# Patient Record
Sex: Male | Born: 1995 | Race: White | Hispanic: No | Marital: Single | Smoking: Never smoker
Health system: Southern US, Community
[De-identification: ages and names within clinical notes are randomized; demographics above are authoritative.]

## PROBLEM LIST (undated history)

## (undated) DIAGNOSIS — I514 Myocarditis, unspecified: Secondary | ICD-10-CM

---
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
## 2017-05-24 ENCOUNTER — Observation Stay
Admission: EM | Admit: 2017-05-24 | Discharge: 2017-05-25 | Disposition: A | Discharge status: Home / Self Care | Payer: 59 | Attending: Internal Medicine | Admitting: Internal Medicine

## 2017-05-24 ENCOUNTER — Encounter: Payer: Self-pay | Admitting: Emergency Medicine

## 2017-05-24 ENCOUNTER — Emergency Department: Payer: 59

## 2017-05-24 ENCOUNTER — Other Ambulatory Visit: Payer: Self-pay

## 2017-05-24 ENCOUNTER — Emergency Department
Admit: 2017-05-24 | Discharge: 2017-05-24 | Disposition: A | Discharge status: Home / Self Care | Payer: 59 | Attending: Emergency Medicine | Admitting: Emergency Medicine

## 2017-05-24 DIAGNOSIS — R7989 Other specified abnormal findings of blood chemistry: Secondary | ICD-10-CM

## 2017-05-24 DIAGNOSIS — E876 Hypokalemia: Secondary | ICD-10-CM | POA: Insufficient documentation

## 2017-05-24 DIAGNOSIS — R748 Abnormal levels of other serum enzymes: Secondary | ICD-10-CM | POA: Diagnosis present

## 2017-05-24 DIAGNOSIS — R079 Chest pain, unspecified: Secondary | ICD-10-CM

## 2017-05-24 DIAGNOSIS — R778 Other specified abnormalities of plasma proteins: Secondary | ICD-10-CM

## 2017-05-24 DIAGNOSIS — B349 Viral infection, unspecified: Secondary | ICD-10-CM | POA: Diagnosis not present

## 2017-05-24 DIAGNOSIS — I409 Acute myocarditis, unspecified: Secondary | ICD-10-CM | POA: Diagnosis not present

## 2017-05-24 DIAGNOSIS — I514 Myocarditis, unspecified: Secondary | ICD-10-CM | POA: Diagnosis present

## 2017-05-24 LAB — URINE DRUG SCREEN, QUALITATIVE (ARMC ONLY)
Amphetamines, Ur Screen: NOT DETECTED
BARBITURATES, UR SCREEN: NOT DETECTED
Benzodiazepine, Ur Scrn: NOT DETECTED
Cannabinoid 50 Ng, Ur ~~LOC~~: NOT DETECTED
Cocaine Metabolite,Ur ~~LOC~~: NOT DETECTED
MDMA (Ecstasy)Ur Screen: NOT DETECTED
METHADONE SCREEN, URINE: NOT DETECTED
Opiate, Ur Screen: NOT DETECTED
Phencyclidine (PCP) Ur S: NOT DETECTED
TRICYCLIC, UR SCREEN: NOT DETECTED

## 2017-05-24 LAB — BASIC METABOLIC PANEL
ANION GAP: 9 (ref 5–15)
BUN: 13 mg/dL (ref 6–20)
CO2: 27 mmol/L (ref 22–32)
Calcium: 9.5 mg/dL (ref 8.9–10.3)
Chloride: 103 mmol/L (ref 101–111)
Creatinine, Ser: 0.91 mg/dL (ref 0.61–1.24)
GFR calc Af Amer: >60 mL/min (ref >60)
GLUCOSE: 109 mg/dL — AB (ref 65–99)
POTASSIUM: 3.3 mmol/L — AB (ref 3.5–5.1)
Sodium: 139 mmol/L (ref 135–145)

## 2017-05-24 LAB — TROPONIN I
TROPONIN I: 8.92 ng/mL — AB (ref <0.03)
Troponin I: 4.67 ng/mL (ref <0.03)
Troponin I: 8.37 ng/mL (ref <0.03)

## 2017-05-24 LAB — CBC
HEMATOCRIT: 46.2 % (ref 40.0–52.0)
HEMOGLOBIN: 15.6 g/dL (ref 13.0–18.0)
MCH: 29 pg (ref 26.0–34.0)
MCHC: 33.7 g/dL (ref 32.0–36.0)
MCV: 86.1 fL (ref 80.0–100.0)
Platelets: 247 10*3/uL (ref 150–440)
RBC: 5.36 MIL/uL (ref 4.40–5.90)
RDW: 12.8 % (ref 11.5–14.5)
WBC: 6.3 10*3/uL (ref 3.8–10.6)

## 2017-05-24 LAB — ECHOCARDIOGRAM COMPLETE
Height: 72 in
WEIGHTICAEL: 3200 [oz_av]

## 2017-05-24 LAB — MAGNESIUM: Magnesium: 2 mg/dL (ref 1.7–2.4)

## 2017-05-24 MED ORDER — ONDANSETRON HCL 4 MG/2ML IJ SOLN: 4.0000 mg | Freq: Four times a day (QID) | INTRAMUSCULAR | Status: DC | PRN

## 2017-05-24 MED ORDER — ONDANSETRON HCL 4 MG PO TABS: 4.0000 mg | ORAL_TABLET | Freq: Four times a day (QID) | ORAL | Status: DC | PRN

## 2017-05-24 MED ORDER — POTASSIUM CHLORIDE CRYS ER 20 MEQ PO TBCR
20.0000 meq | EXTENDED_RELEASE_TABLET | Freq: Two times a day (BID) | ORAL | Status: DC
Start: 2017-05-24 — End: 2017-05-25
  Administered 2017-05-24 – 2017-05-25 (×3): 20 meq via ORAL
  Filled 2017-05-24 (×3): qty 1

## 2017-05-24 MED ORDER — ASPIRIN 81 MG PO CHEW
324.0000 mg | CHEWABLE_TABLET | Freq: Once | ORAL | Status: AC
  Administered 2017-05-24: 324 mg via ORAL
  Filled 2017-05-24: qty 4

## 2017-05-24 MED ORDER — ACETAMINOPHEN 650 MG RE SUPP: 650.0000 mg | Freq: Four times a day (QID) | RECTAL | Status: DC | PRN

## 2017-05-24 MED ORDER — ACETAMINOPHEN 325 MG PO TABS: 650.0000 mg | ORAL_TABLET | Freq: Four times a day (QID) | ORAL | Status: DC | PRN

## 2017-05-24 MED ORDER — SODIUM CHLORIDE 0.9% FLUSH
3.0000 mL | Freq: Two times a day (BID) | INTRAVENOUS | Status: DC
  Administered 2017-05-24: 3 mL via INTRAVENOUS

## 2017-05-24 NOTE — Progress Notes (Signed)
*  PRELIMINARY RESULTS* Echocardiogram 2D Echocardiogram has been performed.  Dean Simmons, Dean Simmons 05/24/2017, 2:11 PM

## 2017-05-24 NOTE — ED Provider Notes (Signed)
[facility] Emergency Department Provider Note  ____________________________________________  Time seen: Approximately 12:39 PM  I have reviewed the triage vital signs and the nursing notes.   HISTORY  Chief Complaint Chest Pain   HPI Dean Simmons is a 21 y.o. male no significant past medical history who presents for evaluation of chest pain. Patient reports that the pain started as a mild pressure in the center of his chest 2 days ago. Has been constant and this morning became severe. He endorses mild shortness of breath associated with it. Pain is not pleuritic. The pain is worse when he lays flat. Patient had subjective fever 2 days ago when his symptoms started and mild congestion. According to the mother, the entire family has been exposed to several viruses recently in the household.patient has family history of heart attacks in grandfather at old age. patient denies drugs or smoking. no personal or family history of blood clots, no recent travel immobilization, no leg pain or swelling, no hemoptysis, no exogenous hormones. No cough. History reviewed. No pertinent past medical history.  There are no active problems to display for this patient.   History reviewed. No pertinent surgical history.  Prior to Admission medications   Medication Sig Start Date End Date Taking? Authorizing Provider  cephALEXin (KEFLEX) 500 MG capsule Take 1 capsule (500 mg total) by mouth 2 (two) times daily. Patient not taking: Reported on 05/24/2017 12/27/15   Jene Every, MD    Allergies Patient has no known allergies.  History reviewed. No pertinent family history.  Social History Social History   Tobacco Use  . Smoking status: Never Smoker  . Smokeless tobacco: Never Used  Substance Use Topics  . Alcohol use: No    Frequency: Never  . Drug use: No    Review of Systems  Constitutional: Negative for fever. Eyes: Negative for visual changes. ENT:  Negative for sore throat. Neck: No neck pain  Cardiovascular: + chest pain. Respiratory: + shortness of breath. Gastrointestinal: Negative for abdominal pain, vomiting or diarrhea. Genitourinary: Negative for dysuria. Musculoskeletal: Negative for back pain. Skin: Negative for rash. Neurological: Negative for headaches, weakness or numbness. Psych: No SI or HI  ____________________________________________   PHYSICAL EXAM:  VITAL SIGNS: ED Triage Vitals  Enc Vitals Group     BP 05/24/17 1137 120/64     Pulse Rate 05/24/17 1137 85     Resp 05/24/17 1137 16     Temp 05/24/17 1137 98.6 F (37 C)     Temp Source 05/24/17 1137 Oral     SpO2 05/24/17 1137 97 %     Weight 05/24/17 1138 200 lb (90.7 kg)     Height 05/24/17 1138 6' (1.829 m)     Head Circumference --      Peak Flow --      Pain Score 05/24/17 1137 6     Pain Loc --      Pain Edu? --      Excl. in GC? --     Constitutional: Alert and oriented. Well appearing and in no apparent distress. HEENT:      Head: Normocephalic and atraumatic.         Eyes: Conjunctivae are normal. Sclera is non-icteric.       Mouth/Throat: Mucous membranes are moist.       Neck: Supple with no signs of meningismus. Cardiovascular: Regular rate and rhythm. No murmurs, gallops, or rubs. 2+ symmetrical distal pulses are present in all extremities. No JVD.  Respiratory: Normal respiratory effort. Lungs are clear to auscultation bilaterally. No wheezes, crackles, or rhonchi.  Gastrointestinal: Soft, non tender, and non distended with positive bowel sounds. No rebound or guarding. Musculoskeletal: Nontender with normal range of motion in all extremities. No edema, cyanosis, or erythema of extremities. Neurologic: Normal speech and language. Face is symmetric. Moving all extremities. No gross focal neurologic deficits are appreciated. Skin: Skin is warm, dry and intact. No rash noted. Psychiatric: Mood and affect are normal. Speech and behavior  are normal.  ____________________________________________   LABS (all labs ordered are listed, but only abnormal results are displayed)  Labs Reviewed  BASIC METABOLIC PANEL - Abnormal; Notable for the following components:      Result Value   Potassium 3.3 (*)    Glucose, Bld 109 (*)    All other components within normal limits  TROPONIN I - Abnormal; Notable for the following components:   Troponin I 4.67 (*)    All other components within normal limits  CBC  URINE DRUG SCREEN, QUALITATIVE ([facility] ONLY)   ____________________________________________  EKG  ED ECG REPORT I, Nita Sicklearolina Devell Parkerson, the attending physician, personally viewed and interpreted this ECG.  11:34 - normal sinus rhythm, rate of 77, normal intervals, normal axis, less than 1 mm elevation in aVL with no reciprocal changes.  11:56 - NSR, Rate of 76, normal intervals, normal axis, resolution of elevation in aVL, no ST elevations or depressions.  12:41 - NSR, rate of 72, normal intervals, normal axis, slight PR elevation in aVL with a diffuse mild ST elevation. ____________________________________________  RADIOLOGY  CXR:  No active cardiopulmonary disease. ____________________________________________   PROCEDURES  Procedure(s) performed: None Procedures Critical Care performed: yes  CRITICAL CARE Performed by: Nita Sicklearolina Bradi Arbuthnot  ?  Total critical care time: 40 min  Critical care time was exclusive of separately billable procedures and treating other patients.  Critical care was necessary to treat or prevent imminent or life-threatening deterioration.  Critical care was time spent personally by me on the following activities: development of treatment plan with patient and/or surrogate as well as nursing, discussions with consultants, evaluation of patient's response to treatment, examination of patient, obtaining history from patient or surrogate, ordering and performing treatments and  interventions, ordering and review of laboratory studies, ordering and review of radiographic studies, pulse oximetry and re-evaluation of patient's condition.  ____________________________________________   INITIAL IMPRESSION / ASSESSMENT AND PLAN / ED COURSE  21 y.o. male no significant past medical history who presents for evaluation of chest pain x 2 days worse when lying flat that started when patient had subjective fever and congestion. patient is extremely well appearing, no distress, is normal vital signs, cardiac exam shows no rubs or murmurs, lungs are clear to auscultation, there is no pitting edema. Initial EKG had a less than 1 mm elevation in aVL were no reciprocal changes which resolved on serial EKGs. Patient does seem to have myocarditis based on history, exam. Patient given ASA. Pain is improving and now is 1/10. Discussed with Dr. Okey DupreEnd, on call for cath lab who agrees EKG and story not consistent with STEMI. Consulted Dr. Juliann Paresallwood who recommended ECHO STAT. Spoke with ECHO lab who will come to the ED soon. Patient will continue to be monitored closely on telemetry and reassess frequently. He is going to be admitted to the hospitalist service.      As part of my medical decision making, I reviewed the following data within the electronic MEDICAL RECORD NUMBER History obtained from  family, Nursing notes reviewed and incorporated, Labs reviewed , EKG interpreted , Radiograph reviewed , Discussed with admitting physician , A consult was requested and obtained from this/these consultant(s) Cardiology, Notes from prior ED visits and [place] Controlled Substance Database    Pertinent labs & imaging results that were available during my care of the patient were reviewed by me and considered in my medical decision making (see chart for details).    ____________________________________________   FINAL CLINICAL IMPRESSION(S) / ED DIAGNOSES  Final diagnoses:  Chest pain, unspecified type    Elevated troponin  Acute myocarditis, unspecified myocarditis type      NEW MEDICATIONS STARTED DURING THIS VISIT:  ED Discharge Orders    None       Note:  This document was prepared using Dragon voice recognition software and may include unintentional dictation errors.    Nita SickleVeronese, [place], MD 05/24/17 [phone]

## 2017-05-24 NOTE — ED Notes (Signed)
Patient has urinal at bedside and instructions for use.

## 2017-05-24 NOTE — Significant Event (Signed)
Cardiology STEMI Evaluation Note  Date: 05/24/17 Time: 12:49 PM  I was contacted by Dr. Don PerkingVeronese to evaluate the patient's EKG for possible STEMI.  Patient is a 21 year old with intermittent chest pain for the last 2-3 days and subjective fever.  Pain is reportedly worse with deep inspiration.  Initial troponin is elevated at 4.7.  I have personally reviewed today's EKGs, which demonstrate sinus rhythm and sinus arrhythmia with nonspecific ST changes.  There is subtle ST elevation in multiple leads, though the tracings do not meet STEMI criteria.  Subtle PR depression is also noted.  Given the patient's age, pain, EKG findings, and elevated troponin, I am most suspicious for myopericarditis as opposed to STEMI related to occlusion of an epicardial coronary artery.  I have recommended that Dr. Don PerkingVeronese consult with the on-call general cardiology provider for further assessment.  Yvonne Kendallhristopher Kenidy Crossland, MD [facility] Pager: [phone]

## 2017-05-24 NOTE — ED Triage Notes (Addendum)
Pt c/o central chest tightness that is frequent but intermittent since Sunday. Pain is worse today so came in. Denies SHOB or cough.  Mom thought he felt warm previously but did not check temp. No drug use. Some elevation on EKG

## 2017-05-24 NOTE — H&P (Signed)
[employer] [facility] [place] [facility]   PATIENT NAME: Dean Simmons    MR#:  [id number]  DATE OF BIRTH:  10/09/1995  DATE OF ADMISSION:  05/24/2017  PRIMARY CARE PHYSICIAN: Patient, No Pcp Per   REQUESTING/REFERRING PHYSICIAN: Dr. Nita Sicklearolina Veronese  CHIEF COMPLAINT:   Chief Complaint  Patient presents with  . Chest Pain    HISTORY OF PRESENT ILLNESS:  Dean Simmons  is a 21 y.o. male with no known past medical history who presents to the hospital due to chest pain. Patient says he developed substernal chest pain this morning in the center of his chest nonradiating and not associated with any nausea vomiting diaphoresis. He did have some episodic palpitations and dizziness which have now resolved. Patient presented to the ER as his mother noticed that his heart rate was significantly elevated. In the ER patient underwent routine blood work and was noted to have an elevated troponin of over 4. EKG shows no acute ST or T-wave changes consistent with ischemia. Patient is suspected to have a viral myocarditis and therefore hospitalist services were contacted further treatment and evaluation. As per the mom everyone in the house was sick during the holidays with a viral flu. Patient presently denies any upper respiratory a GI symptoms.  PAST MEDICAL HISTORY:  History reviewed. No pertinent past medical history.  PAST SURGICAL HISTORY:  History reviewed. No pertinent surgical history.  SOCIAL HISTORY:   Social History   Tobacco Use  . Smoking status: Never Smoker  . Smokeless tobacco: Never Used  Substance Use Topics  . Alcohol use: No    Frequency: Never    FAMILY HISTORY:   Family History  Problem Relation Age of Onset  . Heart disease Paternal Grandfather     DRUG ALLERGIES:  No Known Allergies  REVIEW OF SYSTEMS:   Review of Systems  Constitutional: Negative for chills and fever.  HENT: Negative for congestion and tinnitus.   Eyes: Negative for  blurred vision and double vision.  Respiratory: Negative for cough, shortness of breath and wheezing.   Cardiovascular: Positive for chest pain. Negative for orthopnea and PND.  Gastrointestinal: Negative for abdominal pain, diarrhea, nausea and vomiting.  Genitourinary: Negative for dysuria and hematuria.  Neurological: Negative for dizziness, sensory change and focal weakness.  All other systems reviewed and are negative.   MEDICATIONS AT HOME:   Prior to Admission medications   Medication Sig Start Date End Date Taking? Authorizing Provider  cephALEXin (KEFLEX) 500 MG capsule Take 1 capsule (500 mg total) by mouth 2 (two) times daily. Patient not taking: Reported on 05/24/2017 12/27/15   Jene EveryKinner, Robert, MD      VITAL SIGNS:  Blood pressure 117/73, pulse 79, temperature 98.6 F (37 C), temperature source Oral, resp. rate 13, height 6' (1.829 m), weight 90.7 kg (200 lb), SpO2 100 %.  PHYSICAL EXAMINATION:  Physical Exam  GENERAL:  21 y.o.-year-old patient lying in the bed in no acute distress.  EYES: Pupils equal, round, reactive to light and accommodation. No scleral icterus. Extraocular muscles intact.  HEENT: Head atraumatic, normocephalic. Oropharynx and nasopharynx clear. No oropharyngeal erythema, moist oral mucosa  NECK:  Supple, no jugular venous distention. No thyroid enlargement, no tenderness.  LUNGS: Normal breath sounds bilaterally, no wheezing, rales, rhonchi. No use of accessory muscles of respiration.  CARDIOVASCULAR: S1, S2 RRR. No murmurs, rubs, gallops, clicks.  ABDOMEN: Soft, nontender, nondistended. Bowel sounds present. No organomegaly or mass.  EXTREMITIES: No pedal edema, cyanosis, or clubbing. +  2 pedal & radial pulses b/l.   NEUROLOGIC: Cranial nerves II through XII are intact. No focal Motor or sensory deficits appreciated b/l PSYCHIATRIC: The patient is alert and oriented x 3. Good affect.  SKIN: No obvious rash, lesion, or ulcer.   LABORATORY PANEL:    CBC Recent Labs  Lab 05/24/17 1138  WBC 6.3  HGB 15.6  HCT 46.2  PLT 247   ------------------------------------------------------------------------------------------------------------------  Chemistries  Recent Labs  Lab 05/24/17 1138  NA 139  K 3.3*  CL 103  CO2 27  GLUCOSE 109*  BUN 13  CREATININE 0.91  CALCIUM 9.5   ------------------------------------------------------------------------------------------------------------------  Cardiac Enzymes Recent Labs  Lab 05/24/17 1138  TROPONINI 4.67*   ------------------------------------------------------------------------------------------------------------------  RADIOLOGY:  Dg Chest 2 View  Result Date: 05/24/2017 CLINICAL DATA:  Mid chest pain for 2 days. EXAM: CHEST  2 VIEW COMPARISON:  None. FINDINGS: Cardiomediastinal silhouette is normal. Mediastinal contours appear intact. There is no evidence of focal airspace consolidation, pleural effusion or pneumothorax. Osseous structures are without acute abnormality. Soft tissues are grossly normal. IMPRESSION: No active cardiopulmonary disease. Electronically Signed   By: Ted Mcalpineobrinka  Dimitrova M.D.   On: 05/24/2017 12:26     IMPRESSION AND PLAN:   21 year old male with no significant past medical history who presents to the hospital with chest pain and noted to have a elevated Trop.   1. Acute myocarditis-this is the suspected diagnosis for patient's chest pain with elevated troponin. Patient's EKG does not show any evidence of ST or T-wave changes suggestive of ischemia. -We'll observe him on telemetry, cycle his cardiac markers. Continue supportive care. Echocardiogram has been ordered. We'll get a cardiology consult and discussed the case with Dr. Juliann Paresallwood.  2. Hypokalemia - will place on Oral Potassium supplements. Repeat in a.m.  - check Mg. Level.    All the records are reviewed and case discussed with ED provider. Management plans discussed with the  patient, family and they are in agreement.  CODE STATUS: Full code  TOTAL TIME TAKING CARE OF THIS PATIENT: 40 minutes.    Houston SirenSAINANI,Vincy Feliz J M.D on 05/24/2017 at 2:00 PM  Between 7am to 6pm - Pager - [phone]  After 6pm go to [email or web address] - password [email or web address]  Fabio [facility]  Office  [phone]  CC: Primary care physician; Patient, No Pcp Per

## 2017-05-24 NOTE — ED Notes (Signed)
Patient taken to xray.

## 2017-05-24 NOTE — ED Notes (Signed)
Dr. Don PerkingVeronese aware of Troponin of 4.67.

## 2017-05-24 NOTE — ED Notes (Signed)
Patient is unable to void at this time. Family at bedside.

## 2017-05-25 DIAGNOSIS — I409 Acute myocarditis, unspecified: Secondary | ICD-10-CM | POA: Diagnosis not present

## 2017-05-25 LAB — CBC
HCT: 45.7 % (ref 40.0–52.0)
HEMOGLOBIN: 15.3 g/dL (ref 13.0–18.0)
MCH: 28.9 pg (ref 26.0–34.0)
MCHC: 33.5 g/dL (ref 32.0–36.0)
MCV: 86.3 fL (ref 80.0–100.0)
Platelets: 266 10*3/uL (ref 150–440)
RBC: 5.3 MIL/uL (ref 4.40–5.90)
RDW: 12.8 % (ref 11.5–14.5)
WBC: 6.2 10*3/uL (ref 3.8–10.6)

## 2017-05-25 LAB — BASIC METABOLIC PANEL
ANION GAP: 8 (ref 5–15)
BUN: 10 mg/dL (ref 6–20)
CALCIUM: 9.3 mg/dL (ref 8.9–10.3)
CHLORIDE: 106 mmol/L (ref 101–111)
CO2: 25 mmol/L (ref 22–32)
CREATININE: 0.67 mg/dL (ref 0.61–1.24)
GFR calc non Af Amer: >60 mL/min (ref >60)
GLUCOSE: 90 mg/dL (ref 65–99)
Potassium: 3.9 mmol/L (ref 3.5–5.1)
Sodium: 139 mmol/L (ref 135–145)

## 2017-05-25 LAB — INFLUENZA PANEL BY PCR (TYPE A & B)
Influenza A By PCR: NEGATIVE
Influenza B By PCR: NEGATIVE

## 2017-05-25 LAB — HIV ANTIBODY (ROUTINE TESTING W REFLEX): HIV SCREEN 4TH GENERATION: NONREACTIVE

## 2017-05-25 LAB — TROPONIN I: Troponin I: 5.59 ng/mL (ref <0.03)

## 2017-05-25 NOTE — Consult Note (Signed)
Reason for Consult: Elevated troponins abnormal EKG possible myocarditis Referring Physician: Dr. Manuella Ghazi hospitalist  Dean Simmons is an 21 y.o. male.  HPI: Patient is a 21 year old white male who works at [employer] developed a febrile illness over the weekend fever weakness fatigue patient subsequently developed chest discomfort became excruciating midsternal without radiation so he came to the emergency room for evaluation.  Patient states fever eventually resolved he just took over-the-counter medication denies any previous cardiac history no smoking.  Patient states to be doing reasonably well now but was seen in the emergency room was found to have elevated troponin with abnormal EKG. patient had improvement chest pain since his pain anywhere denies any rash no worsening headache.  Chest pain is not related to activity or movement.  History reviewed. No pertinent past medical history.  History reviewed. No pertinent surgical history.  Family History  Problem Relation Age of Onset  . Heart disease Paternal Grandfather     Social History:  reports that  has never smoked. he has never used smokeless tobacco. He reports that he does not drink alcohol or use drugs.  Allergies: No Known Allergies  Medications: I have reviewed the patient's current medications.  Results for orders placed or performed during the hospital encounter of 05/24/17 (from the past 48 hour(s))  Basic metabolic panel     Status: Abnormal   Collection Time: 05/24/17 11:38 AM  Result Value Ref Range   Sodium 139 135 - 145 mmol/L   Potassium 3.3 (L) 3.5 - 5.1 mmol/L   Chloride 103 101 - 111 mmol/L   CO2 27 22 - 32 mmol/L   Glucose, Bld 109 (H) 65 - 99 mg/dL   BUN 13 6 - 20 mg/dL   Creatinine, Ser 0.91 0.61 - 1.24 mg/dL   Calcium 9.5 8.9 - 10.3 mg/dL   GFR calc non Af Amer >60 >60 mL/min   GFR calc Af Amer >60 >60 mL/min    Comment: (NOTE) The eGFR has been calculated using the CKD EPI equation. This calculation  has not been validated in all clinical situations. eGFR's persistently <60 mL/min signify possible Chronic Kidney Disease.    Anion gap 9 5 - 15  CBC     Status: None   Collection Time: 05/24/17 11:38 AM  Result Value Ref Range   WBC 6.3 3.8 - 10.6 K/uL   RBC 5.36 4.40 - 5.90 MIL/uL   Hemoglobin 15.6 13.0 - 18.0 g/dL   HCT 46.2 40.0 - 52.0 %   MCV 86.1 80.0 - 100.0 fL   MCH 29.0 26.0 - 34.0 pg   MCHC 33.7 32.0 - 36.0 g/dL   RDW 12.8 11.5 - 14.5 %   Platelets 247 150 - 440 K/uL  Troponin I     Status: Abnormal   Collection Time: 05/24/17 11:38 AM  Result Value Ref Range   Troponin I 4.67 (HH) <0.03 ng/mL    Comment: CRITICAL RESULT CALLED TO, READ BACK BY AND VERIFIED WITH TONJA WEAVER AT 1234 05/24/17 DAS   Magnesium     Status: None   Collection Time: 05/24/17 11:38 AM  Result Value Ref Range   Magnesium 2.0 1.7 - 2.4 mg/dL  HIV antibody (Routine Testing)     Status: None   Collection Time: 05/24/17  3:42 PM  Result Value Ref Range   HIV Screen 4th Generation wRfx Non Reactive Non Reactive    Comment: (NOTE) Performed At: [facility] [place], [place] [id number] Rush Farmer MD  RS:[id number]   Troponin I     Status: Abnormal   Collection Time: 05/24/17  3:42 PM  Result Value Ref Range   Troponin I 8.92 (HH) <0.03 ng/mL    Comment: CRITICAL VALUE NOTED. VALUE IS CONSISTENT WITH PREVIOUSLY REPORTED/CALLED VALUE.  TFK  Troponin I     Status: Abnormal   Collection Time: 05/24/17  7:25 PM  Result Value Ref Range   Troponin I 8.37 (HH) <0.03 ng/mL    Comment: CRITICAL VALUE NOTED. VALUE IS CONSISTENT WITH PREVIOUSLY REPORTED/CALLED VALUE JJB  Urine Drug Screen, Qualitative ([place] only)     Status: None   Collection Time: 05/24/17 10:42 PM  Result Value Ref Range   Tricyclic, Ur Screen NONE DETECTED NONE DETECTED   Amphetamines, Ur Screen NONE DETECTED NONE DETECTED   MDMA (Ecstasy)Ur Screen NONE DETECTED NONE DETECTED   Cocaine Metabolite,Ur [place]  NONE DETECTED NONE DETECTED   Opiate, Ur Screen NONE DETECTED NONE DETECTED   Phencyclidine (PCP) Ur S NONE DETECTED NONE DETECTED   Cannabinoid 50 Ng, Ur [place] NONE DETECTED NONE DETECTED   Barbiturates, Ur Screen NONE DETECTED NONE DETECTED   Benzodiazepine, Ur Scrn NONE DETECTED NONE DETECTED   Methadone Scn, Ur NONE DETECTED NONE DETECTED    Comment: (NOTE) 093  Tricyclics, urine               Cutoff 1000 ng/mL 200  Amphetamines, urine             Cutoff 1000 ng/mL 300  MDMA (Ecstasy), urine           Cutoff 500 ng/mL 400  Cocaine Metabolite, urine       Cutoff 300 ng/mL 500  Opiate, urine                   Cutoff 300 ng/mL 600  Phencyclidine (PCP), urine      Cutoff 25 ng/mL 700  Cannabinoid, urine              Cutoff 50 ng/mL 800  Barbiturates, urine             Cutoff 200 ng/mL 900  Benzodiazepine, urine           Cutoff 200 ng/mL 1000 Methadone, urine                Cutoff 300 ng/mL 1100 1200 The urine drug screen provides only a preliminary, unconfirmed 1300 analytical test result and should not be used for non-medical 1400 purposes. Clinical consideration and professional judgment should 1500 be applied to any positive drug screen result due to possible 1600 interfering substances. A more specific alternate chemical method 1700 must be used in order to obtain a confirmed analytical result.  1800 Gas chromato graphy / mass spectrometry (GC/MS) is the preferred 1900 confirmatory method.   Troponin I     Status: Abnormal   Collection Time: 05/24/17 11:00 PM  Result Value Ref Range   Troponin I 5.59 (HH) <0.03 ng/mL    Comment: CRITICAL VALUE NOTED. VALUE IS CONSISTENT WITH PREVIOUSLY REPORTED/CALLED VALUE ALV  Basic metabolic panel     Status: None   Collection Time: 05/25/17  5:03 AM  Result Value Ref Range   Sodium 139 135 - 145 mmol/L   Potassium 3.9 3.5 - 5.1 mmol/L   Chloride 106 101 - 111 mmol/L   CO2 25 22 - 32 mmol/L   Glucose, Bld 90 65 - 99 mg/dL   BUN 10 6 -  20 mg/dL  Creatinine, Ser 0.67 0.61 - 1.24 mg/dL   Calcium 9.3 8.9 - 10.3 mg/dL   GFR calc non Af Amer >60 >60 mL/min   GFR calc Af Amer >60 >60 mL/min    Comment: (NOTE) The eGFR has been calculated using the CKD EPI equation. This calculation has not been validated in all clinical situations. eGFR's persistently <60 mL/min signify possible Chronic Kidney Disease.    Anion gap 8 5 - 15  CBC     Status: None   Collection Time: 05/25/17  5:03 AM  Result Value Ref Range   WBC 6.2 3.8 - 10.6 K/uL   RBC 5.30 4.40 - 5.90 MIL/uL   Hemoglobin 15.3 13.0 - 18.0 g/dL   HCT 45.7 40.0 - 52.0 %   MCV 86.3 80.0 - 100.0 fL   MCH 28.9 26.0 - 34.0 pg   MCHC 33.5 32.0 - 36.0 g/dL   RDW 12.8 11.5 - 14.5 %   Platelets 266 150 - 440 K/uL    Dg Chest 2 View  Result Date: 05/24/2017 CLINICAL DATA:  Mid chest pain for 2 days. EXAM: CHEST  2 VIEW COMPARISON:  None. FINDINGS: Cardiomediastinal silhouette is normal. Mediastinal contours appear intact. There is no evidence of focal airspace consolidation, pleural effusion or pneumothorax. Osseous structures are without acute abnormality. Soft tissues are grossly normal. IMPRESSION: No active cardiopulmonary disease. Electronically Signed   By: Fidela Salisbury M.D.   On: 05/24/2017 12:26    Review of Systems  Constitutional: Positive for diaphoresis, fever and malaise/fatigue.  HENT: Positive for congestion.   Eyes: Negative.   Respiratory: Positive for shortness of breath.   Cardiovascular: Positive for chest pain.  Gastrointestinal: Negative.   Genitourinary: Negative.   Musculoskeletal: Positive for myalgias.  Skin: Negative.   Neurological: Positive for weakness.  Endo/Heme/Allergies: Negative.   Psychiatric/Behavioral: Negative.    Blood pressure 109/69, pulse 81, temperature 97.7 F (36.5 C), temperature source Oral, resp. rate 18, height 6' (1.829 m), weight 200 lb (90.7 kg), SpO2 99 %. Physical Exam  Nursing note and vitals  reviewed. Constitutional: He is oriented to person, place, and time. He appears well-developed and well-nourished.  HENT:  Head: Normocephalic and atraumatic.  Eyes: Conjunctivae and EOM are normal. Pupils are equal, round, and reactive to light.  Neck: Normal range of motion. Neck supple.  Cardiovascular: Normal rate, regular rhythm and normal heart sounds.  Respiratory: Effort normal and breath sounds normal.  GI: Soft. Bowel sounds are normal.  Musculoskeletal: Normal range of motion.  Neurological: He is alert and oriented to person, place, and time. He has normal reflexes.  Skin: Skin is warm and dry.  Psychiatric: He has a normal mood and affect.    Assessment/Plan: Probable myocarditis Elevated troponin Viral syndrome Abnormal EKG . Plan Agree with supportive care Hydration Recommend NSAID therapy Continue to follow up troponins With unremarkable echocardiogram would recommend conservative cardiac input Recommend generalized rest and recovery Recommend influenza screening   Hazelle Woollard D Slate Debroux 05/25/2017, 9:18 AM

## 2017-05-25 NOTE — Discharge Instructions (Signed)
Myocarditis, Adult °Myocarditis is a swelling (inflammation) of the heart muscle (myocardium). When the heart becomes inflamed, it cannot pump as well. Severe cases of myocarditis can cause heart failure. °What are the causes? °It is usually caused by a viral infection, although other causes are possible. °What are the signs or symptoms? °Mild cases of myocarditis may not have symptoms. If symptoms do occur, they may include: °· Chest pain. °· Shortness of breath. °· Fast or abnormal heart rhythms. °· Fatigue. °· Fluid retention or swelling in the feet or legs. °· Fever. °· Body aches. °· Fainting. ° °How is this diagnosed? °Myocarditis can be hard to diagnose because it can mimic other diseases. Myocarditis may be suspected if the above symptoms have appeared after a recent infection. A physical exam and other tests may be used to confirm the diagnosis. Some of these tests are: °· Blood tests to check for signs of infection or heart damage. °· Electrocardiography that shows your heart's electrical patterns and rhythms. °· A chest X-ray exam to look at your heart and lungs. °· Echocardiography or other imaging tests to look at how well your heart is working. °· Your health care provider also may recommend a heart (cardiac) catheterization. In this test, a flexible tube (catheter) is inserted into a blood vessel then threaded into the heart. A special instrument can then remove tiny samples of heart muscle tissue (biopsy). The samples are sent to a lab for analysis. ° °How is this treated? °Treatment of myocarditis is aimed at treating the underlying cause and may involve: °· Heart medicine such as beta blockers or angiotensin-converting enzyme (ACE) inhibitors. These help strengthen the heart and help it beat more regularly. °· Diuretic medicine. Extra fluid in the body can make the heart work harder. Diuretic medicine can help get rid of the extra fluid. °· Steroid medicine. In some cases of myocarditis, steroid  medicine is used to reduce swelling. ° °This information is not intended to replace advice given to you by your health care provider. Make sure you discuss any questions you have with your health care provider. °Document Released: 05/06/2005 Document Revised: 11/20/2015 Document Reviewed: 11/06/2012 °Elsevier Interactive Patient Education © 2017 Elsevier Inc. ° °

## 2017-05-25 NOTE — Progress Notes (Signed)
Pt. Discharged home today via wheelchair, with mother. Pt VSS with no complaints.  Pt. And mother educated on medical condition and provided with scheduled follow up appointment w/ Dr. Juliann Paresallwood.

## 2017-05-26 NOTE — Discharge Summary (Signed)
[facility] [place] [facility]   PATIENT NAME: Dean Simmons    MR#:  [id number]  DATE OF BIRTH:  12/03/1995  DATE OF ADMISSION:  05/24/2017   ADMITTING PHYSICIAN: Houston SirenVivek J Sainani, MD  DATE OF DISCHARGE: 05/25/2017  1:12 PM  PRIMARY CARE PHYSICIAN: Patient, No Pcp Per   ADMISSION DIAGNOSIS:  Elevated troponin [R74.8] Chest pain, unspecified type [R07.9] Acute myocarditis, unspecified myocarditis type [I40.9] DISCHARGE DIAGNOSIS:  Active Problems:   Myocarditis (HCC)  SECONDARY DIAGNOSIS:  History reviewed. No pertinent past medical history. HOSPITAL COURSE:  [age] year old male with no significant past medical history admitted with chest pain and noted to have a elevated Trop.   1. Acute myocarditis-this is the likely diagnosis for patient's chest pain with elevated troponin. Patient's EKG does not show any evidence of ST or T-wave changes suggestive of ischemia. - Echocardiogram wnl, seen by Dr. Juliann Paresallwood. - supportive mgmt as this is viral in nature. Patient asymptomatic  2. Hypokalemia - Repleted and Resolved DISCHARGE CONDITIONS:  stable CONSULTS OBTAINED:   DRUG ALLERGIES:  No Known Allergies DISCHARGE MEDICATIONS:   Allergies as of 05/25/2017   No Known Allergies     Medication List    STOP taking these medications   cephALEXin 500 MG capsule Commonly known as:  KEFLEX        DISCHARGE INSTRUCTIONS:   DIET:  Regular diet DISCHARGE CONDITION:  Good ACTIVITY:  Activity as tolerated OXYGEN:  Home Oxygen: No.  Oxygen Delivery: room air DISCHARGE LOCATION:  home   If you experience worsening of your admission symptoms, develop shortness of breath, life threatening emergency, suicidal or homicidal thoughts you must seek medical attention immediately by calling 911 or calling your MD immediately  if symptoms less severe.  You Must read complete instructions/literature along with all the possible adverse reactions/side  effects for all the Medicines you take and that have been prescribed to you. Take any new Medicines after you have completely understood and accpet all the possible adverse reactions/side effects.   Please note  You were cared for by a hospitalist during your hospital stay. If you have any questions about your discharge medications or the care you received while you were in the hospital after you are discharged, you can call the unit and asked to speak with the hospitalist on call if the hospitalist that took care of you is not available. Once you are discharged, your primary care physician will handle any further medical issues. Please note that NO REFILLS for any discharge medications will be authorized once you are discharged, as it is imperative that you return to your primary care physician (or establish a relationship with a primary care physician if you do not have one) for your aftercare needs so that they can reassess your need for medications and monitor your lab values.    On the day of Discharge:  VITAL SIGNS:  Blood pressure 109/69, pulse 81, temperature 97.7 F (36.5 C), temperature source Oral, resp. rate 18, height 6' (1.829 m), weight 90.7 kg (200 lb), SpO2 99 %. PHYSICAL EXAMINATION:  GENERAL:  21 y.o.-year-old patient lying in the bed with no acute distress.  EYES: Pupils equal, round, reactive to light and accommodation. No scleral icterus. Extraocular muscles intact.  HEENT: Head atraumatic, normocephalic. Oropharynx and nasopharynx clear.  NECK:  Supple, no jugular venous distention. No thyroid enlargement, no tenderness.  LUNGS: Normal breath sounds bilaterally, no wheezing, rales,rhonchi or crepitation. No use of accessory muscles of respiration.  CARDIOVASCULAR: S1, S2 normal. No murmurs, rubs, or gallops.  ABDOMEN: Soft, non-tender, non-distended. Bowel sounds present. No organomegaly or mass.  EXTREMITIES: No pedal edema, cyanosis, or clubbing.  NEUROLOGIC: Cranial  nerves II through XII are intact. Muscle strength 5/5 in all extremities. Sensation intact. Gait not checked.  PSYCHIATRIC: The patient is alert and oriented x 3.  SKIN: No obvious rash, lesion, or ulcer.  DATA REVIEW:   CBC Recent Labs  Lab 05/25/17 0503  WBC 6.2  HGB 15.3  HCT 45.7  PLT 266    Chemistries  Recent Labs  Lab 05/24/17 1138 05/25/17 0503  NA 139 139  K 3.3* 3.9  CL 103 106  CO2 27 25  GLUCOSE 109* 90  BUN 13 10  CREATININE 0.91 0.67  CALCIUM 9.5 9.3  MG 2.0  --      Follow-up Information    Alwyn Peaallwood, Dwayne D, MD. Go on 06/01/2017.   Specialties:  Cardiology, Internal Medicine Why:  @11 :30 Contact information: [address] [facility] [place] [id number] [phone]           Management plans discussed with the patient, family and they are in agreement.  CODE STATUS: Prior   TOTAL TIME TAKING CARE OF THIS PATIENT: 45 minutes.    Delfino LovettVipul Asencion Guisinger M.D on 05/26/2017 at 6:56 PM  Between 7am to 6pm - Pager - [phone]  After 6pm go to [email or web address] - Social research officer, [email or web address] [email or web address]  [facility] [facility]  Office  [phone]  CC: Primary care physician; Patient, No Pcp Per   Note: This dictation was prepared with Dragon dictation along with smaller phrase technology. Any transcriptional errors that result from this process are unintentional.

## 2017-08-29 IMAGING — DX DG FOOT 2V*R*
2 series · 2 of 2 positions shown · non-contrast
Comparison: None.

CLINICAL DATA: Snake bite to the medial right heel.

EXAM:
RIGHT FOOT - 2 VIEW

[foot ap]
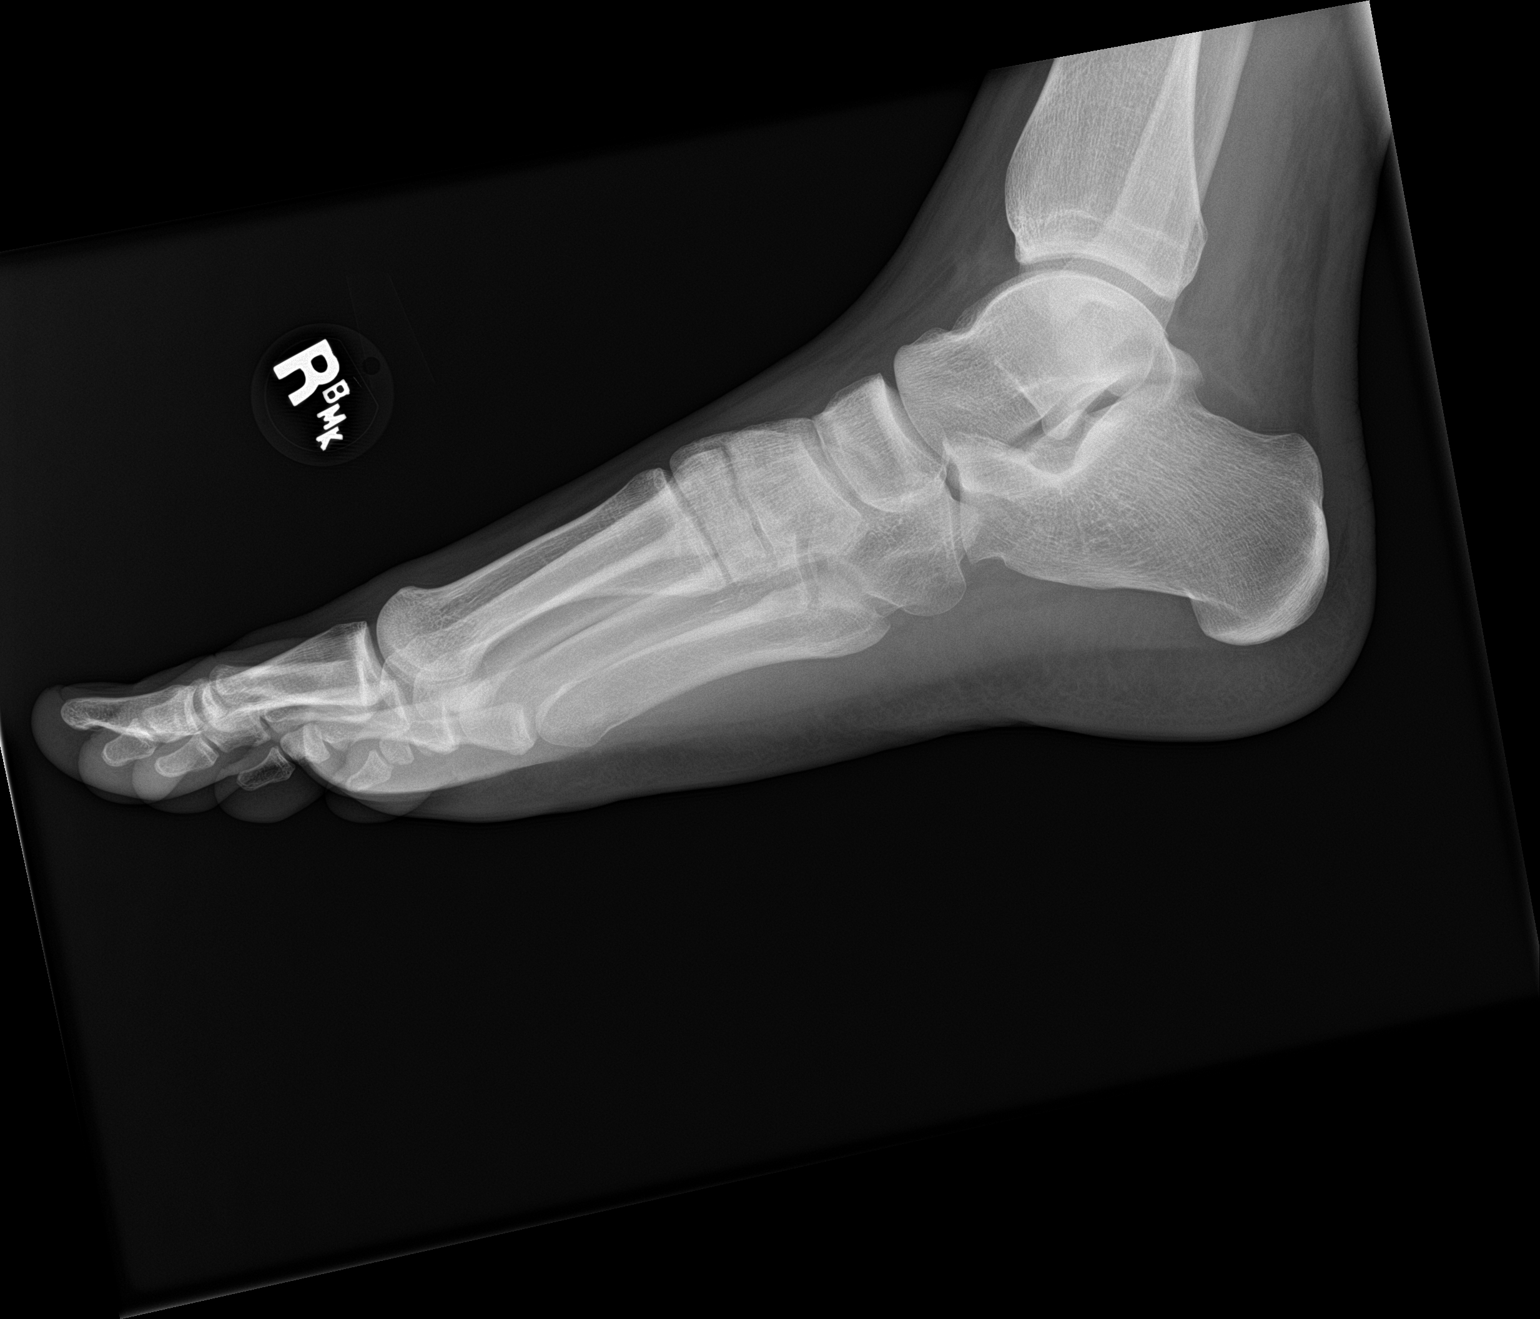

[foot lat]
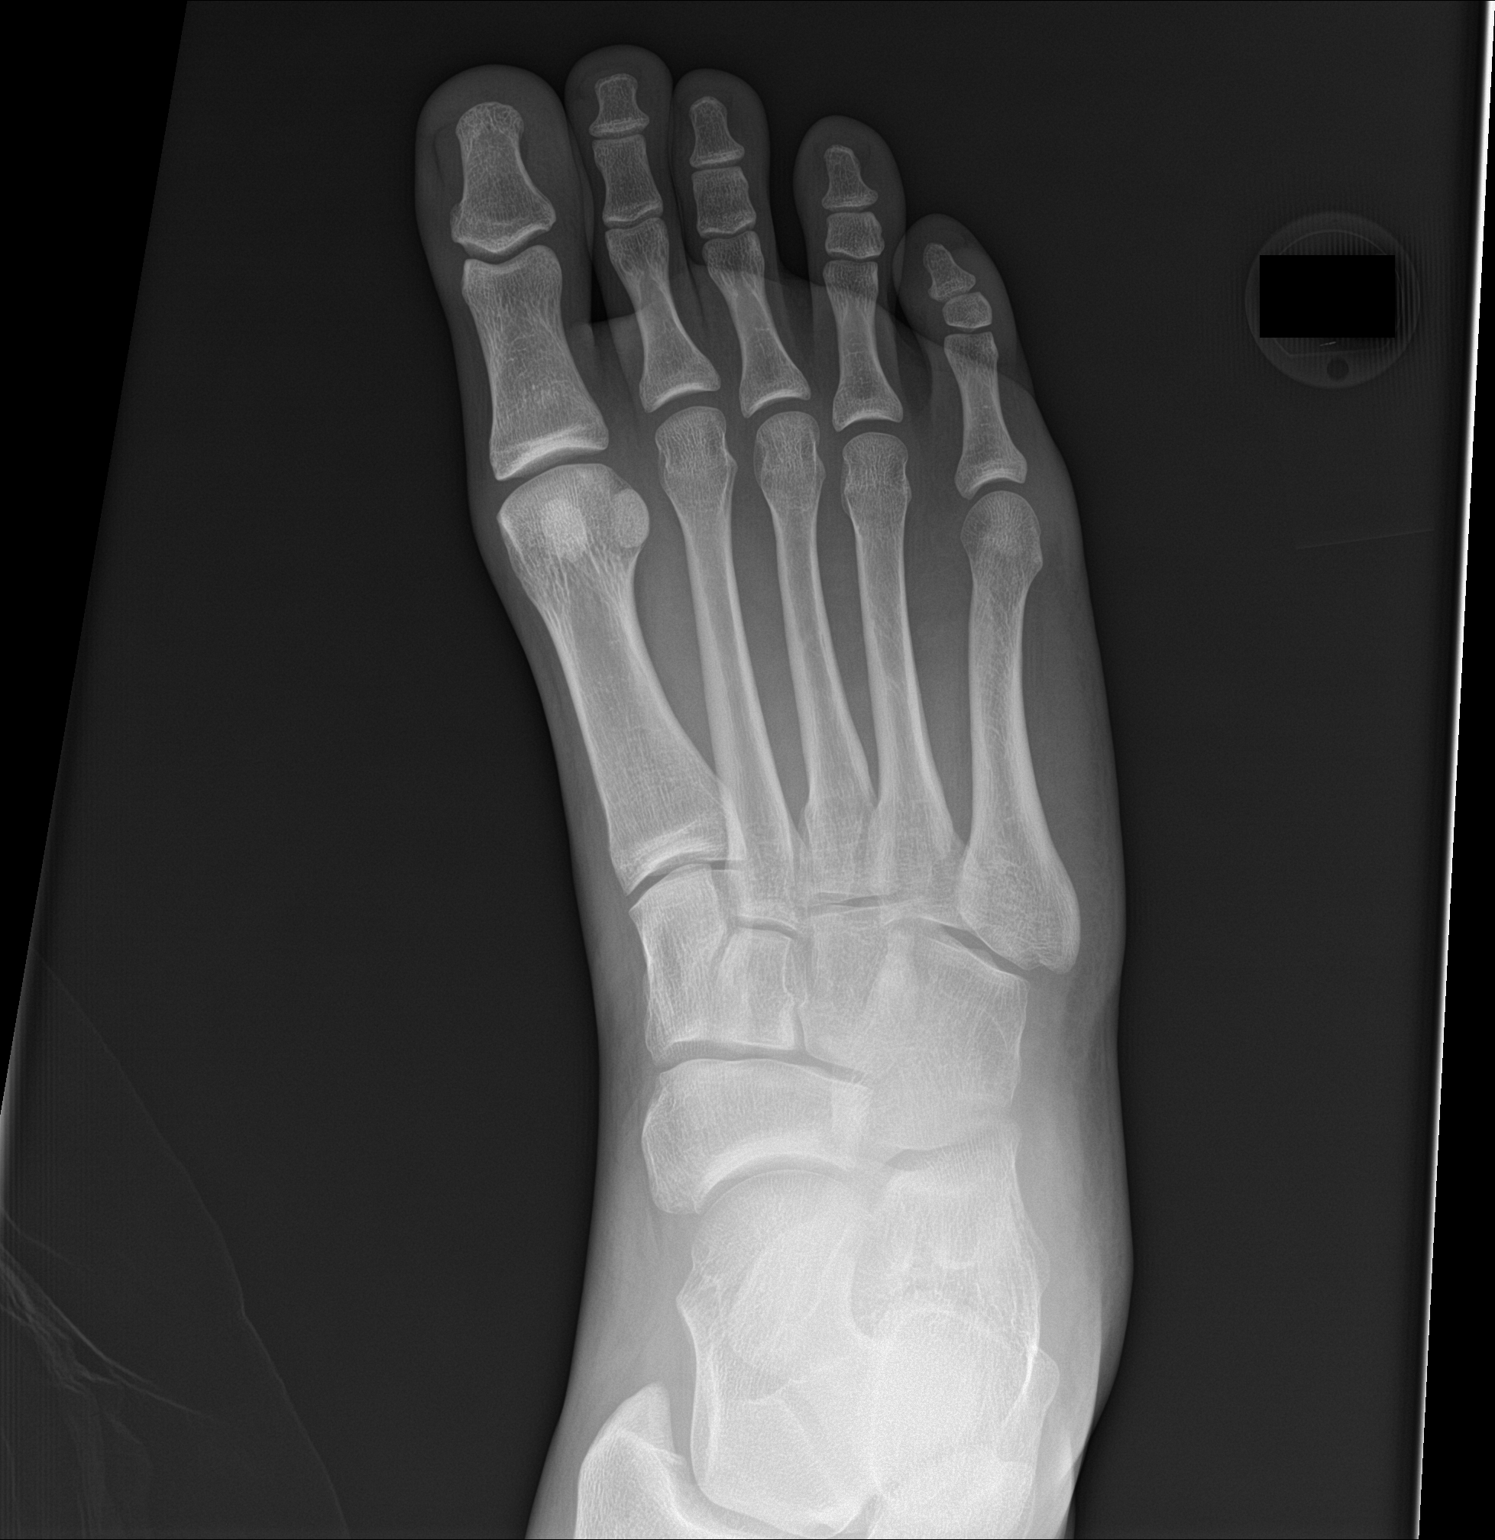

[2 of 2 positions shown; findings below may reference images not displayed]

FINDINGS: No soft tissue gas or soft tissue foreign body. No bony injury.
Normal bones and articulations.
IMPRESSION: Negative.

## 2018-05-27 ENCOUNTER — Other Ambulatory Visit: Payer: Self-pay

## 2018-05-27 ENCOUNTER — Emergency Department: Payer: Commercial Managed Care - PPO

## 2018-05-27 ENCOUNTER — Inpatient Hospital Stay
Admission: EM | Admit: 2018-05-27 | Discharge: 2018-05-28 | DRG: 315 | Disposition: A | Discharge status: Home / Self Care | Payer: Commercial Managed Care - PPO | Attending: Internal Medicine | Admitting: Internal Medicine

## 2018-05-27 DIAGNOSIS — I319 Disease of pericardium, unspecified: Secondary | ICD-10-CM | POA: Diagnosis present

## 2018-05-27 DIAGNOSIS — R079 Chest pain, unspecified: Secondary | ICD-10-CM | POA: Diagnosis present

## 2018-05-27 DIAGNOSIS — Z8249 Family history of ischemic heart disease and other diseases of the circulatory system: Secondary | ICD-10-CM | POA: Diagnosis not present

## 2018-05-27 DIAGNOSIS — I409 Acute myocarditis, unspecified: Secondary | ICD-10-CM | POA: Diagnosis present

## 2018-05-27 HISTORY — DX: Myocarditis, unspecified: I51.4

## 2018-05-27 LAB — TROPONIN I
TROPONIN I: 0.23 ng/mL — AB (ref <0.03)
Troponin I: 0.28 ng/mL (ref <0.03)
Troponin I: 0.38 ng/mL (ref <0.03)

## 2018-05-27 LAB — BASIC METABOLIC PANEL
Anion gap: 9 (ref 5–15)
BUN: 12 mg/dL (ref 6–20)
CHLORIDE: 101 mmol/L (ref 98–111)
CO2: 28 mmol/L (ref 22–32)
CREATININE: 0.8 mg/dL (ref 0.61–1.24)
Calcium: 9.2 mg/dL (ref 8.9–10.3)
GFR calc Af Amer: >60 mL/min (ref >60)
GFR calc non Af Amer: >60 mL/min (ref >60)
Glucose, Bld: 102 mg/dL — ABNORMAL HIGH (ref 70–99)
Potassium: 4 mmol/L (ref 3.5–5.1)
Sodium: 138 mmol/L (ref 135–145)

## 2018-05-27 LAB — CBC
HEMATOCRIT: 48.6 % (ref 39.0–52.0)
Hemoglobin: 16.2 g/dL (ref 13.0–17.0)
MCH: 28.2 pg (ref 26.0–34.0)
MCHC: 33.3 g/dL (ref 30.0–36.0)
MCV: 84.7 fL (ref 80.0–100.0)
NRBC: 0 % (ref 0.0–0.2)
Platelets: 218 10*3/uL (ref 150–400)
RBC: 5.74 MIL/uL (ref 4.22–5.81)
RDW: 12.4 % (ref 11.5–15.5)
WBC: 5.1 10*3/uL (ref 4.0–10.5)

## 2018-05-27 LAB — TSH: TSH: 1.835 u[IU]/mL (ref 0.350–4.500)

## 2018-05-27 MED ORDER — ACETAMINOPHEN 325 MG PO TABS: 650.0000 mg | ORAL_TABLET | Freq: Four times a day (QID) | ORAL | Status: DC | PRN

## 2018-05-27 MED ORDER — ONDANSETRON HCL 4 MG/2ML IJ SOLN: 4.0000 mg | Freq: Four times a day (QID) | INTRAMUSCULAR | Status: DC | PRN

## 2018-05-27 MED ORDER — IBUPROFEN 800 MG PO TABS
800.0000 mg | ORAL_TABLET | Freq: Once | ORAL | Status: AC
  Administered 2018-05-27: 800 mg via ORAL
  Filled 2018-05-27: qty 1

## 2018-05-27 MED ORDER — ACETAMINOPHEN 650 MG RE SUPP: 650.0000 mg | Freq: Four times a day (QID) | RECTAL | Status: DC | PRN

## 2018-05-27 MED ORDER — ENOXAPARIN SODIUM 40 MG/0.4ML ~~LOC~~ SOLN
40.0000 mg | SUBCUTANEOUS | Status: DC
  Administered 2018-05-27: 40 mg via SUBCUTANEOUS
  Filled 2018-05-27: qty 0.4

## 2018-05-27 MED ORDER — ALBUTEROL SULFATE (2.5 MG/3ML) 0.083% IN NEBU: 2.5000 mg | INHALATION_SOLUTION | RESPIRATORY_TRACT | Status: DC | PRN

## 2018-05-27 MED ORDER — ONDANSETRON HCL 4 MG PO TABS: 4.0000 mg | ORAL_TABLET | Freq: Four times a day (QID) | ORAL | Status: DC | PRN

## 2018-05-27 MED ORDER — SODIUM CHLORIDE 0.9 % IV SOLN
Freq: Once | INTRAVENOUS | Status: AC
  Administered 2018-05-27: 17:00:00 via INTRAVENOUS

## 2018-05-27 MED ORDER — IBUPROFEN 600 MG PO TABS
600.0000 mg | ORAL_TABLET | Freq: Four times a day (QID) | ORAL | Status: DC
  Administered 2018-05-27 – 2018-05-28 (×2): 600 mg via ORAL
  Filled 2018-05-27 (×6): qty 1

## 2018-05-27 MED ORDER — SODIUM CHLORIDE 0.9% FLUSH
3.0000 mL | Freq: Two times a day (BID) | INTRAVENOUS | Status: DC
  Administered 2018-05-27 – 2018-05-28 (×2): 3 mL via INTRAVENOUS

## 2018-05-27 NOTE — Progress Notes (Signed)
Pt admitted to unit. Oriented to room. ivf's infusing. Pt is painfree. Tele is nsr. 02 sats 100% on room air. Family at bedside.

## 2018-05-27 NOTE — ED Triage Notes (Addendum)
Pt comes via POV from home with c/o chest pain. Pt states central chest pain. Pt denies any radiation. Pt states chest pain started yesterday.  Pt states he has been sick since Monday with bad headaches and feeling hot.  Mom states hx of myocarditis about a year ago.

## 2018-05-27 NOTE — ED Provider Notes (Signed)
[facility] Emergency Department Provider Note  Time seen: 2:30 PM  I have reviewed the triage vital signs and the nursing notes.   HISTORY  Chief Complaint Chest Pain    HPI Dean Simmons is a 22 y.o. male with a past medical history of myocarditis 1 year ago who presents to the emergency department for chest pain and trouble breathing.  According to the patient for the past 5 days he has been experiencing subjective fever with congestion.  Yesterday developed shortness of breath and mild chest discomfort.  Patient states he had similar symptoms 1 year ago when he was ultimately diagnosed with myocarditis, so the patient came to the emergency department for evaluation.  States minimal chest discomfort currently but she describes more as a tightness/pressure.  Mild shortness of breath much worse with exertion per patient.  Denies any leg pain or swelling.  No abdominal pain.  No nausea vomiting or diarrhea.  Minimal sore throat.   Past Medical History:  Diagnosis Date  . Myocarditis (HCC) 2018    Patient Active Problem List   Diagnosis Date Noted  . Myocarditis (HCC) 05/24/2017    History reviewed. No pertinent surgical history.  Prior to Admission medications   Not on File    No Known Allergies  Family History  Problem Relation Age of Onset  . Heart disease Paternal Grandfather     Social History Social History   Tobacco Use  . Smoking status: Never Smoker  . Smokeless tobacco: Never Used  Substance Use Topics  . Alcohol use: No    Frequency: Never  . Drug use: No    Review of Systems Constitutional: Subjective fever x5 days ENT: Mild sore throat Cardiovascular: Mild chest pressure/tightness since last night Respiratory: Shortness of breath worse with exertion since last night Gastrointestinal: Negative for abdominal pain, vomiting and diarrhea. Genitourinary: Negative for urinary compaints Musculoskeletal: Negative for  musculoskeletal complaints Skin: Negative for skin complaints  Neurological: Negative for headache All other ROS negative  ____________________________________________   PHYSICAL EXAM:  VITAL SIGNS: ED Triage Vitals [05/27/18 1244]  Enc Vitals Group     BP 112/72     Pulse Rate 99     Resp 18     Temp 98.2 F (36.8 C)     Temp Source Oral     SpO2 98 %     Weight 190 lb (86.2 kg)     Height 6' (1.829 m)     Head Circumference      Peak Flow      Pain Score 2     Pain Loc      Pain Edu?      Excl. in GC?    Constitutional: Alert and oriented. Well appearing and in no distress. Eyes: Normal exam ENT   Head: Normocephalic and atraumatic.   Mouth/Throat: Mucous membranes are moist. Cardiovascular: Normal rate, regular rhythm. No murmur Respiratory: Normal respiratory effort without tachypnea nor retractions. Breath sounds are clear  Gastrointestinal: Soft and nontender. No distention.  Musculoskeletal: Nontender with normal range of motion in all extremities. No lower extremity tenderness or edema. Neurologic:  Normal speech and language. No gross focal neurologic deficits Skin:  Skin is warm, dry and intact.  Psychiatric: Mood and affect are normal.   ____________________________________________    EKG  EKG reviewed and interpreted by myself shows a sinus tachycardia 104 bpm with a narrow QRS, normal axis, largely normal intervals.  Patient does have slight ST elevation in inferolateral  leads.  No reciprocal depressions.  Possibly consistent with early repolarization versus pericarditis. ____________________________________________    RADIOLOGY   IMPRESSION: No active cardiopulmonary disease.  ____________________________________________   INITIAL IMPRESSION / ASSESSMENT AND PLAN / ED COURSE  Pertinent labs & imaging results that were available during my care of the patient were reviewed by me and considered in my medical decision making (see chart for  details).  Patient presents to the emergency department for chest discomfort shortness of breath worse with exertion.  Differential would include recurrent myocarditis, ACS, pulmonary edema, pneumonia, pneumothorax, upper respiratory infection, bronchitis.  Patient's labs are resulted showing a elevation of troponin 0 0.28.  EKG consistent with pericarditis versus early repolarization.  I discussed patient with Dr. Juliann Pares of cardiology.  We will send blood cultures, we will treat with NSAIDs and have the patient admitted to the hospitalist service.  ____________________________________________   FINAL CLINICAL IMPRESSION(S) / ED DIAGNOSES  Pericarditis Myocarditis    Minna Antis, MD 05/27/18 1436

## 2018-05-27 NOTE — H&P (Signed)
[facility] [place] at [facility]   PATIENT NAME: Dean Simmons    MR#:  [id number]  DATE OF BIRTH:  11/16/1995  DATE OF ADMISSION:  05/27/2018  PRIMARY CARE PHYSICIAN: Patient, No Pcp Per   REQUESTING/REFERRING PHYSICIAN: dr Thomasene MohairPaduhowski  CHIEF COMPLAINT:   Chest pain shortness of breath since yesterday. Sinus congestion for 2 to 3 days HISTORY OF PRESENT ILLNESS:  Dean Simmons  is a 22 y.o. 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 Patient is similar presentation about a year ago and did fairly well after treated for myocarditis. Troponin was .28. EKG no shows sinus tachycardia with some STT changes. ER physician spoke with Dr. Juliann Paresallwood will admit patient for NSAIDs and cycle troponin's.  No family in the ER.  PAST MEDICAL HISTORY:   Past Medical History:  Diagnosis Date  . Myocarditis (HCC) 2018    PAST SURGICAL HISTOIRY:  History reviewed. No pertinent surgical history.  SOCIAL HISTORY:   Social History   Tobacco Use  . Smoking status: Never Smoker  . Smokeless tobacco: Never Used  Substance Use Topics  . Alcohol use: No    Frequency: Never    FAMILY HISTORY:   Family History  Problem Relation Age of Onset  . Heart disease Paternal Grandfather     DRUG ALLERGIES:  No Known Allergies  REVIEW OF SYSTEMS:  Review of Systems  Constitutional: Negative for chills, fever and weight loss.  HENT: Positive for congestion. Negative for ear discharge, ear pain and nosebleeds.   Eyes: Negative for blurred vision, pain and discharge.  Respiratory: Positive for shortness of breath. Negative for sputum production, wheezing and stridor.   Cardiovascular: Positive for chest pain. Negative for palpitations, orthopnea and PND.  Gastrointestinal: Negative for abdominal pain, diarrhea, nausea and  vomiting.  Genitourinary: Negative for frequency and urgency.  Musculoskeletal: Negative for back pain and joint pain.  Neurological: Negative for sensory change, speech change, focal weakness and weakness.  Psychiatric/Behavioral: Negative for depression and hallucinations. The patient is not nervous/anxious.      MEDICATIONS AT HOME:   Prior to Admission medications   Not on File      VITAL SIGNS:  Blood pressure (!) 113/91, pulse 81, temperature 98.2 F (36.8 C), temperature source Oral, resp. rate 19, height 6' (1.829 m), weight 86.2 kg, SpO2 99 %.  PHYSICAL EXAMINATION:  GENERAL:  22 y.o.-year-old patient lying in the bed with no acute distress.  EYES: Pupils equal, round, reactive to light and accommodation. No scleral icterus. Extraocular muscles intact.  HEENT: Head atraumatic, normocephalic. Oropharynx and nasopharynx clear.  NECK:  Supple, no jugular venous distention. No thyroid enlargement, no tenderness. No rub LUNGS: Normal breath sounds bilaterally, no wheezing, rales,rhonchi or crepitation. No use of accessory muscles of respiration.  CARDIOVASCULAR: S1, S2 normal. No murmurs, rubs, or gallops.  ABDOMEN: Soft, nontender, nondistended. Bowel sounds present. No organomegaly or mass.  EXTREMITIES: No pedal edema, cyanosis, or clubbing.  NEUROLOGIC: Cranial nerves II through XII are intact. Muscle strength 5/5 in all extremities. Sensation intact. Gait not checked.  PSYCHIATRIC: The patient is alert and oriented x 3.  SKIN: No obvious rash, lesion, or ulcer.   LABORATORY PANEL:   CBC Recent Labs  Lab 05/27/18 1246  WBC 5.1  HGB 16.2  HCT 48.6  PLT 218   ------------------------------------------------------------------------------------------------------------------  Chemistries  Recent Labs  Lab 05/27/18 1246  NA 138  K 4.0  CL 101  CO2 28  GLUCOSE 102*  BUN 12  CREATININE 0.80  CALCIUM 9.2    ------------------------------------------------------------------------------------------------------------------  Cardiac Enzymes Recent Labs  Lab 05/27/18 1246  TROPONINI 0.28*   ------------------------------------------------------------------------------------------------------------------  RADIOLOGY:  Dg Chest 2 View  Result Date: 05/27/2018 CLINICAL DATA:  Chest pain EXAM: CHEST - 2 VIEW COMPARISON:  05/24/2017 FINDINGS: The heart size and mediastinal contours are within normal limits. Both lungs are clear. The visualized skeletal structures are unremarkable. IMPRESSION: No active cardiopulmonary disease. Electronically Signed   By: Signa Kell M.D.   On: 05/27/2018 13:32    EKG:   Sinus tachycardia with diffuse STT changes.(self read) IMPRESSION AND PLAN:   Dean Simmons  is a 22 y.o. 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  1. chest pain on exertion with shortness of breath sinus congestion and mild elevated troponin suspected acute myocarditis -patient had similar presentation about a year ago -admit to telemetry floor -cycle cardiac enzymes times three -echo of the heart -Dr. Juliann Pares to see patient--- informed by ER physician  2. DVT prophylaxis subcu Lovenox   No family in the ER All the records are reviewed and case discussed with ED provider. Management plans discussed with the patient, family and they are in agreement.  CODE STATUS: full  TOTAL TIME TAKING CARE OF THIS PATIENT: *40* minutes.    Enedina Finner M.D on 05/27/2018 at 3:42 PM  Between 7am to 6pm - Pager - [phone]  After 6pm go to [email or web address] - password [email or web address] [facility]  [facility]  Office  [phone]  CC: Primary care physician; Patient, No Pcp Per

## 2018-05-27 NOTE — ED Notes (Signed)
Pt flagged has having legal guardian. Mother with patient and asked if she is legal guardian. Mother states that he is 22 years old.

## 2018-05-28 ENCOUNTER — Inpatient Hospital Stay
Admit: 2018-05-28 | Discharge: 2018-05-28 | Disposition: A | Discharge status: Home / Self Care | Payer: Commercial Managed Care - PPO | Attending: Internal Medicine | Admitting: Internal Medicine

## 2018-05-28 LAB — ECHOCARDIOGRAM COMPLETE
Height: 72 in
Weight: 3009.6 oz

## 2018-05-28 LAB — TROPONIN I: Troponin I: 0.13 ng/mL (ref <0.03)

## 2018-05-28 MED ORDER — IBUPROFEN 400 MG PO TABS: 400.0000 mg | ORAL_TABLET | Freq: Three times a day (TID) | ORAL | 0 refills | Status: AC | PRN

## 2018-05-28 NOTE — Discharge Summary (Signed)
[facility] - [place] at [facility]   PATIENT NAME: Dean Simmons    MR#:  [id number]  DATE OF BIRTH:  05-22-1996  DATE OF ADMISSION:  05/27/2018   ADMITTING PHYSICIAN: Enedina Finner, MD  DATE OF DISCHARGE:  05/28/18  PRIMARY CARE PHYSICIAN: Patient, No Pcp Per   ADMISSION DIAGNOSIS:   Chest pain, unspecified type [R07.9] Acute myocarditis, unspecified myocarditis type [I40.9]  DISCHARGE DIAGNOSIS:   Active Problems:   Chest pain   SECONDARY DIAGNOSIS:   Past Medical History:  Diagnosis Date  . Myocarditis (HCC) 2018    HOSPITAL COURSE:   22 year old male with no significant past medical history with upper respiratory infection recently presents to hospital secondary to chest pain  1.  Chest pain with elevated troponin-concern for possible myocarditis/pericarditis -Improved with ibuprofen -Echocardiogram is done and pending -Cardiology consulted.  No further cardiac work-up recommended -Troponins are plateaued. -Patient denies any chest pain at this time.  Up and ambulating without difficulty. -Can be discharged home with ibuprofen as needed.  DISCHARGE CONDITIONS:   Guarded  CONSULTS OBTAINED:   Treatment Team:  Alwyn Pea, MD  DRUG ALLERGIES:   No Known Allergies DISCHARGE MEDICATIONS:   Allergies as of 05/28/2018   No Known Allergies     Medication List    TAKE these medications   ibuprofen 400 MG tablet Commonly known as:  ADVIL,MOTRIN Take 1 tablet (400 mg total) by mouth every 8 (eight) hours as needed.        DISCHARGE INSTRUCTIONS:   1. PCP f/u in 1 week  DIET:   Regular diet  ACTIVITY:   Activity as tolerated  OXYGEN:   Home Oxygen: No.  Oxygen Delivery: room air  DISCHARGE LOCATION:   home   If you experience worsening of your admission symptoms, develop shortness of breath, life threatening emergency, suicidal or homicidal thoughts you must seek medical attention immediately by calling  911 or calling your MD immediately  if symptoms less severe.  You Must read complete instructions/literature along with all the possible adverse reactions/side effects for all the Medicines you take and that have been prescribed to you. Take any new Medicines after you have completely understood and accpet all the possible adverse reactions/side effects.   Please note  You were cared for by a hospitalist during your hospital stay. If you have any questions about your discharge medications or the care you received while you were in the hospital after you are discharged, you can call the unit and asked to speak with the hospitalist on call if the hospitalist that took care of you is not available. Once you are discharged, your primary care physician will handle any further medical issues. Please note that NO REFILLS for any discharge medications will be authorized once you are discharged, as it is imperative that you return to your primary care physician (or establish a relationship with a primary care physician if you do not have one) for your aftercare needs so that they can reassess your need for medications and monitor your lab values.    On the day of Discharge:  VITAL SIGNS:   Blood pressure 116/70, pulse 65, temperature 98 F (36.7 C), temperature source Oral, resp. rate 18, height 6' (1.829 m), weight 85.3 kg, SpO2 100 %.  PHYSICAL EXAMINATION:    GENERAL:  22 y.o.-year-old patient lying in the bed with no acute distress.  EYES: Pupils equal, round, reactive to light and accommodation. No scleral icterus. Extraocular muscles  intact.  HEENT: Head atraumatic, normocephalic. Oropharynx and nasopharynx clear.  NECK:  Supple, no jugular venous distention. No thyroid enlargement, no tenderness.  LUNGS: Normal breath sounds bilaterally, no wheezing, rales,rhonchi or crepitation. No use of accessory muscles of respiration.  CARDIOVASCULAR: S1, S2 normal. No murmurs, rubs, or gallops.  No chest  wall tenderness. -Pain does not worsen with movement, position or deep inspiration ABDOMEN: Soft, non-tender, non-distended. Bowel sounds present. No organomegaly or mass.  EXTREMITIES: No pedal edema, cyanosis, or clubbing.  NEUROLOGIC: Cranial nerves II through XII are intact. Muscle strength 5/5 in all extremities. Sensation intact. Gait not checked.  PSYCHIATRIC: The patient is alert and oriented x 3.  SKIN: No obvious rash, lesion, or ulcer.   DATA REVIEW:   CBC Recent Labs  Lab 05/27/18 1246  WBC 5.1  HGB 16.2  HCT 48.6  PLT 218    Chemistries  Recent Labs  Lab 05/27/18 1246  NA 138  K 4.0  CL 101  CO2 28  GLUCOSE 102*  BUN 12  CREATININE 0.80  CALCIUM 9.2     Microbiology Results  Results for orders placed or performed during the hospital encounter of 05/27/18  Blood culture (routine x 2)     Status: None (Preliminary result)   Collection Time: 05/27/18  3:04 PM  Result Value Ref Range Status   Specimen Description BLOOD RAC  Final   Special Requests   Final    BOTTLES DRAWN AEROBIC AND ANAEROBIC Blood Culture adequate volume   Culture   Final    NO GROWTH < 24 HOURS Performed at [facility], [address], [place] at [place], [place] [address]    Report Status PENDING  Incomplete  Blood culture (routine x 2)     Status: None (Preliminary result)   Collection Time: 05/27/18  3:04 PM  Result Value Ref Range Status   Specimen Description BLOOD LAC  Final   Special Requests   Final    BOTTLES DRAWN AEROBIC AND ANAEROBIC Blood Culture results may not be optimal due to an excessive volume of blood received in culture bottles   Culture   Final    NO GROWTH < 24 HOURS Performed at [facility], [address], [place], [place] [address]    Report Status PENDING  Incomplete    RADIOLOGY:  Dg Chest 2 View  Result Date: 05/27/2018 CLINICAL DATA:  Chest pain EXAM: CHEST - 2 VIEW COMPARISON:  05/24/2017 FINDINGS: The heart size and mediastinal  contours are within normal limits. Both lungs are clear. The visualized skeletal structures are unremarkable. IMPRESSION: No active cardiopulmonary disease. Electronically Signed   By: Signa Kell M.D.   On: 05/27/2018 13:32     Management plans discussed with the patient, family and they are in agreement.  CODE STATUS:     Code Status Orders  (From admission, onward)         Start     Ordered   05/27/18 1637  Full code  Continuous     05/27/18 1636        Code Status History    Date Active Date Inactive Code Status Order ID Comments User Context   05/24/2017 1527 05/25/2017 1612 Full Code [id number]  Houston Siren, MD Inpatient      TOTAL TIME TAKING CARE OF THIS PATIENT: 38 minutes.    Enid Baas M.D on 05/28/2018 at 1:05 PM  Between 7am to 6pm - Pager - [phone]  After 6pm go to [email or web address] -  password [email or web address]  [facility] [place] [facility]  Office  [phone]  CC: Primary care physician; Patient, No Pcp Per   Note: This dictation was prepared with Dragon dictation along with smaller phrase technology. Any transcriptional errors that result from this process are unintentional.

## 2018-05-28 NOTE — Consult Note (Signed)
Reason for Consult: Abnormal EKG possible myocarditis elevated troponin Referring Physician: Dr. Enedina Finner hospitalist  Dean Simmons is an 22 y.o. male.  HPI: 22 year old white male previous history of possible peri-myocarditis about a year ago presented with possible viral syndrome with chest pain fatigue mild shortness of breath came to the emergency room had a baseline abnormal EKG unchanged from previously so had a borderline troponin so was admitted for further assessment evaluation.  Patient feels much better now does not have a regular physician that he follows up with has not seen anyone since his previous admission a year ago  Past Medical History:  Diagnosis Date  . Myocarditis (HCC) 2018    History reviewed. No pertinent surgical history.  Family History  Problem Relation Age of Onset  . Heart disease Paternal Grandfather     Social History:  reports that he has never smoked. He has never used smokeless tobacco. He reports that he does not drink alcohol or use drugs.  Allergies: No Known Allergies  Medications: I have reviewed the patient's current medications.  Results for orders placed or performed during the hospital encounter of 05/27/18 (from the past 48 hour(s))  Basic metabolic panel     Status: Abnormal   Collection Time: 05/27/18 12:46 PM  Result Value Ref Range   Sodium 138 135 - 145 mmol/L   Potassium 4.0 3.5 - 5.1 mmol/L   Chloride 101 98 - 111 mmol/L   CO2 28 22 - 32 mmol/L   Glucose, Bld 102 (H) 70 - 99 mg/dL   BUN 12 6 - 20 mg/dL   Creatinine, Ser 2.95 0.61 - 1.24 mg/dL   Calcium 9.2 8.9 - 62.1 mg/dL   GFR calc non Af Amer >60 >60 mL/min   GFR calc Af Amer >60 >60 mL/min   Anion gap 9 5 - 15    Comment: Performed at [facility], [address] [place], [place] [address]  CBC     Status: None   Collection Time: 05/27/18 12:46 PM  Result Value Ref Range   WBC 5.1 4.0 - 10.5 K/uL   RBC 5.74 4.22 - 5.81 MIL/uL   Hemoglobin 16.2 13.0 -  17.0 g/dL   HCT 78.4 69.6 - 29.5 %   MCV 84.7 80.0 - 100.0 fL   MCH 28.2 26.0 - 34.0 pg   MCHC 33.3 30.0 - 36.0 g/dL   RDW 28.4 13.2 - 44.0 %   Platelets 218 150 - 400 K/uL   nRBC 0.0 0.0 - 0.2 %    Comment: Performed at [facility], [address] [place], [place] [address]  Troponin I - ONCE - STAT     Status: Abnormal   Collection Time: 05/27/18 12:46 PM  Result Value Ref Range   Troponin I 0.28 (HH) <0.03 ng/mL    Comment: CRITICAL RESULT CALLED TO, READ BACK BY AND VERIFIED WITH KIM GAULT AT 1320 ON 05/27/18 BY SNJ Performed at [facility], [address] [place], [place] [address]   Blood culture (routine x 2)     Status: None (Preliminary result)   Collection Time: 05/27/18  3:04 PM  Result Value Ref Range   Specimen Description BLOOD RAC    Special Requests      BOTTLES DRAWN AEROBIC AND ANAEROBIC Blood Culture adequate volume   Culture      NO GROWTH < 24 HOURS Performed at [facility], [address], [place], [place] [address]    Report Status  PENDING   Blood culture (routine x 2)     Status: None (Preliminary result)   Collection Time: 05/27/18  3:04 PM  Result Value Ref Range   Specimen Description BLOOD LAC    Special Requests      BOTTLES DRAWN AEROBIC AND ANAEROBIC Blood Culture results may not be optimal due to an excessive volume of blood received in culture bottles   Culture      NO GROWTH < 24 HOURS Performed at [facility], [address] [place], [place] [address]    Report Status PENDING   Troponin I - Now Then Q6H     Status: Abnormal   Collection Time: 05/27/18  4:51 PM  Result Value Ref Range   Troponin I 0.38 (HH) <0.03 ng/mL    Comment: CRITICAL VALUE NOTED. VALUE IS CONSISTENT WITH PREVIOUSLY REPORTED/CALLED VALUE.PMF Performed at [facility], [address] [place], [place] [address]   TSH     Status: None   Collection Time: 05/27/18  4:51 PM  Result Value Ref Range   TSH 1.835  0.350 - 4.500 uIU/mL    Comment: Performed by a 3rd Generation assay with a functional sensitivity of <=0.01 uIU/mL. Performed at [facility], [address] [place], [place] [address]   Troponin I - Now Then Q6H     Status: Abnormal   Collection Time: 05/27/18 10:14 PM  Result Value Ref Range   Troponin I 0.23 (HH) <0.03 ng/mL    Comment: CRITICAL VALUE NOTED. VALUE IS CONSISTENT WITH PREVIOUSLY REPORTED/CALLED VALUE / JAG Performed at [facility], [address] [place], [place] [address]   Troponin I - Now Then Q6H     Status: Abnormal   Collection Time: 05/28/18  5:50 AM  Result Value Ref Range   Troponin I 0.13 (HH) <0.03 ng/mL    Comment: Performed at [facility], [address] [place], [place] [address]    Dg Chest 2 View  Result Date: 05/27/2018 CLINICAL DATA:  Chest pain EXAM: CHEST - 2 VIEW COMPARISON:  05/24/2017 FINDINGS: The heart size and mediastinal contours are within normal limits. Both lungs are clear. The visualized skeletal structures are unremarkable. IMPRESSION: No active cardiopulmonary disease. Electronically Signed   By: Signa Kell M.D.   On: 05/27/2018 13:32    Review of Systems  Constitutional: Positive for diaphoresis and malaise/fatigue.  HENT: Positive for congestion.   Eyes: Negative.   Respiratory: Positive for shortness of breath.   Cardiovascular: Positive for chest pain.  Gastrointestinal: Negative.   Genitourinary: Negative.   Musculoskeletal: Positive for myalgias.  Skin: Negative.   Neurological: Positive for weakness.  Endo/Heme/Allergies: Negative.   Psychiatric/Behavioral: Negative.    Blood pressure 116/70, pulse 65, temperature 98 F (36.7 C), temperature source Oral, resp. rate 18, height 6' (1.829 m), weight 85.3 kg, SpO2 100 %. Physical Exam  Nursing note and vitals reviewed. Constitutional: He is oriented to person, place, and time. He appears well-developed and well-nourished.  HENT:   Head: Normocephalic and atraumatic.  Eyes: Pupils are equal, round, and reactive to light. Conjunctivae and EOM are normal.  Neck: Normal range of motion. Neck supple.  Cardiovascular: Normal rate and regular rhythm.  Respiratory: Effort normal and breath sounds normal.  GI: Soft. Bowel sounds are normal.  Musculoskeletal: Normal range of motion.  Neurological: He is alert and oriented to person, place, and time. He has normal reflexes.  Skin: Skin is warm and dry.  Psychiatric: He has  a normal mood and affect.    Assessment/Plan: Atypical chest pain Borderline troponins Abnormal EKG Possible mild pericarditis Possible myocarditis Possible viral syndrome . Plan Agree with observation on telemetry Rule up troponins EKG Continue conservative therapy Ibuprofen as necessary for pain and temperature Follow-up with primary physician as an outpatient   Dwayne D Callwood 05/28/2018, 2:47 PM

## 2018-05-28 NOTE — Plan of Care (Signed)
Patient denies any chest pain

## 2018-05-28 NOTE — Progress Notes (Signed)
*  PRELIMINARY RESULTS* Echocardiogram 2D Echocardiogram has been performed.  Dean GulaJoan M [name] 05/28/2018, 10:39 AM

## 2018-06-01 LAB — CULTURE, BLOOD (ROUTINE X 2)
CULTURE: NO GROWTH
Culture: NO GROWTH
Special Requests: ADEQUATE

## 2018-06-06 ENCOUNTER — Telehealth: Payer: Self-pay

## 2018-06-06 NOTE — Telephone Encounter (Signed)
Flagged on EMMI report for not having a follow up appointment scheduled.  AVS does not mention a follow up needed, however patient's discharge summary includes instruction to follow up with PCP in one week.  Called and spoke with patient.  Made him aware that the hospitalist wanted him to follow up with his PCP in one week.  Apologized this was not included on his discharge papers.  Patient acknowledged this. No questions or concerns at this time.

## 2021-02-23 ENCOUNTER — Other Ambulatory Visit: Payer: Self-pay

## 2021-02-23 ENCOUNTER — Emergency Department: Payer: Commercial Managed Care - PPO

## 2021-02-23 ENCOUNTER — Emergency Department
Admission: EM | Admit: 2021-02-23 | Discharge: 2021-02-23 | Disposition: A | Discharge status: Home / Self Care | Payer: Commercial Managed Care - PPO | Attending: Emergency Medicine | Admitting: Emergency Medicine

## 2021-02-23 DIAGNOSIS — R079 Chest pain, unspecified: Secondary | ICD-10-CM

## 2021-02-23 LAB — CBC
HCT: 46.9 % (ref 39.0–52.0)
Hemoglobin: 16 g/dL (ref 13.0–17.0)
MCH: 29.3 pg (ref 26.0–34.0)
MCHC: 34.1 g/dL (ref 30.0–36.0)
MCV: 85.7 fL (ref 80.0–100.0)
Platelets: 152 10*3/uL (ref 150–400)
RBC: 5.47 MIL/uL (ref 4.22–5.81)
RDW: 12.6 % (ref 11.5–15.5)
WBC: 6.3 10*3/uL (ref 4.0–10.5)
nRBC: 0 % (ref 0.0–0.2)

## 2021-02-23 LAB — LIPASE, BLOOD: Lipase: 24 U/L (ref 11–51)

## 2021-02-23 LAB — BASIC METABOLIC PANEL
Anion gap: 6 (ref 5–15)
BUN: 7 mg/dL (ref 6–20)
CO2: 23 mmol/L (ref 22–32)
Calcium: 9.2 mg/dL (ref 8.9–10.3)
Chloride: 110 mmol/L (ref 98–111)
Creatinine, Ser: 0.85 mg/dL (ref 0.61–1.24)
GFR, Estimated: >60 mL/min (ref >60)
Glucose, Bld: 95 mg/dL (ref 70–99)
Potassium: 4.4 mmol/L (ref 3.5–5.1)
Sodium: 139 mmol/L (ref 135–145)

## 2021-02-23 LAB — HEPATIC FUNCTION PANEL
ALT: 47 U/L — ABNORMAL HIGH (ref 0–44)
AST: 32 U/L (ref 15–41)
Albumin: 4.6 g/dL (ref 3.5–5.0)
Alkaline Phosphatase: 59 U/L (ref 38–126)
Bilirubin, Direct: 0.1 mg/dL (ref 0.0–0.2)
Indirect Bilirubin: 0.9 mg/dL (ref 0.3–0.9)
Total Bilirubin: 1 mg/dL (ref 0.3–1.2)
Total Protein: 7.1 g/dL (ref 6.5–8.1)

## 2021-02-23 LAB — TROPONIN I (HIGH SENSITIVITY)
Troponin I (High Sensitivity): 3 ng/L (ref <18)
Troponin I (High Sensitivity): <2 ng/L (ref <18)

## 2021-02-23 MED ORDER — FAMOTIDINE 20 MG PO TABS: 20.0000 mg | ORAL_TABLET | Freq: Every day | ORAL | 1 refills | Status: AC

## 2021-02-23 MED ORDER — LIDOCAINE VISCOUS HCL 2 % MT SOLN
15.0000 mL | Freq: Once | OROMUCOSAL | Status: AC
  Administered 2021-02-23: 15 mL via ORAL
  Filled 2021-02-23: qty 15

## 2021-02-23 MED ORDER — ALUM & MAG HYDROXIDE-SIMETH 200-200-20 MG/5ML PO SUSP
30.0000 mL | Freq: Once | ORAL | Status: AC
  Administered 2021-02-23: 30 mL via ORAL
  Filled 2021-02-23: qty 30

## 2021-02-23 NOTE — Discharge Instructions (Addendum)
Please seek medical attention for any high fevers, chest pain, shortness of breath, change in behavior, persistent vomiting, bloody stool or any other new or concerning symptoms.  

## 2021-02-23 NOTE — ED Notes (Signed)
MD at the bedside  

## 2021-02-23 NOTE — ED Notes (Signed)
Patient reports he is feeling better after the GI cocktail

## 2021-02-23 NOTE — ED Triage Notes (Signed)
Pt to ED for right sided cp that started at work today, pain intermittent.  Denies n/v/shob Hx of myocarditis    Pt states does not have legal guardian

## 2021-02-23 NOTE — ED Provider Notes (Signed)
[facility] Emergency Department Provider Note   ____________________________________________   I have reviewed the triage vital signs and the nursing notes.   HISTORY  Chief Complaint Chest Pain   History limited by: Not Limited   HPI Dean Simmons is a 25 y.o. male who presents to the emergency department today because of concern for chest pain. The patient states that it started today while he was at work. The pain initially was in the left side of his chest but has then moved to the right side. He describes the pain as being both sharp and dull like. The patient denies any associated shortness of breath. Has not had any nausea or vomiting with the pain. 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 The patient states that over the past couple of days he has felt at his baseline health.    Records reviewed. Per medical record review patient has a history of myocarditis.   Past Medical History:  Diagnosis Date   Myocarditis (HCC) 2018    Patient Active Problem List   Diagnosis Date Noted   Chest pain 05/27/2018   Myocarditis (HCC) 05/24/2017    No past surgical history on file.  Prior to Admission medications   Medication Sig Start Date End Date Taking? Authorizing Provider  ibuprofen (ADVIL,MOTRIN) 400 MG tablet Take 1 tablet (400 mg total) by mouth every 8 (eight) hours as needed. 05/28/18   Enid Baas, MD    Allergies Patient has no known allergies.  Family History  Problem Relation Age of Onset   Heart disease Paternal Grandfather     Social History Social History   Tobacco Use   Smoking status: Never   Smokeless tobacco: Never  Substance Use Topics   Alcohol use: No   Drug use: No    Review of Systems Constitutional: No fever/chills Eyes: No visual changes. ENT: No sore throat. Cardiovascular: Positive for chest pain. Respiratory: Denies shortness of breath. Gastrointestinal: No abdominal pain.  No nausea, no  vomiting.  No diarrhea.   Genitourinary: Negative for dysuria. Musculoskeletal: Negative for back pain. Skin: Negative for rash. Neurological: Negative for headaches, focal weakness or numbness.  ____________________________________________   PHYSICAL EXAM:  VITAL SIGNS: ED Triage Vitals [02/23/21 1825]  Enc Vitals Group     BP 130/86     Pulse Rate 90     Resp 18     Temp 98.4 F (36.9 C)     Temp Source Oral     SpO2 98 %     Weight 180 lb (81.6 kg)     Height 6' (1.829 m)     Head Circumference      Peak Flow      Pain Score 2   Constitutional: Alert and oriented.  Eyes: Conjunctivae are normal.  ENT      Head: Normocephalic and atraumatic.      Nose: No congestion/rhinnorhea.      Mouth/Throat: Mucous membranes are moist.      Neck: No stridor. Hematological/Lymphatic/Immunilogical: No cervical lymphadenopathy. Cardiovascular: Normal rate, regular rhythm.  No murmurs, rubs, or gallops.  Respiratory: Normal respiratory effort without tachypnea nor retractions. Breath sounds are clear and equal bilaterally. No wheezes/rales/rhonchi. Gastrointestinal: Soft and tender to palpation in the epigastric region. Genitourinary: Deferred Musculoskeletal: Normal range of motion in all extremities. No lower extremity edema. Neurologic:  Normal speech and language. No gross focal neurologic deficits are appreciated.  Skin:  Skin is warm, dry and intact. No rash noted. Psychiatric:  Mood and affect are normal. Speech and behavior are normal. Patient exhibits appropriate insight and judgment.  ____________________________________________    LABS (pertinent positives/negatives)  Trop hs 3 CBC wbc 6.3, hgb 16.0, plt 152 BMP wnl  ____________________________________________   EKG  I, Phineas Semen, attending physician, personally viewed and interpreted this EKG  EKG Time: 1830 Rate: 85 Rhythm: normal sinus rhythm Axis: normal Intervals: qtc 402 QRS: narrow ST  changes: no st elevation Impression: normal ekg ____________________________________________    RADIOLOGY  CXR No acute abnormality  ____________________________________________   PROCEDURES  Procedures  ____________________________________________   INITIAL IMPRESSION / ASSESSMENT AND PLAN / ED COURSE  Pertinent labs & imaging results that were available during my care of the patient were reviewed by me and considered in my medical decision making (see chart for details).   Patient presented to the emergency department today because of concerns for chest pain.  On exam he did have some slight epigastric pain.  No respiratory distress.  Lungs clear.  Patient has a history of myocarditis however serial tropes negative here.  Chest x-ray without concerning pneumonia or pneumothorax.  I doubt PE or dissection.  He did feel somewhat better after GI cocktail.  This point I do wonder if heartburn discussed the patient's symptoms.  Discussed this with the patient.  Will start on antiacid and give dietary guidelines.  ____________________________________________   FINAL CLINICAL IMPRESSION(S) / ED DIAGNOSES  Final diagnoses:  Nonspecific chest pain     Note: This dictation was prepared with Dragon dictation. Any transcriptional errors that result from this process are unintentional     Phineas Semen, MD 02/23/21 2259

## 2021-02-23 NOTE — ED Notes (Signed)
Per note in pts chart pt no longer has legal guardian due to age and mother confirmed on last admission per note in chart.

## 2021-11-02 ENCOUNTER — Emergency Department: Payer: Commercial Managed Care - PPO

## 2021-11-02 ENCOUNTER — Encounter: Payer: Self-pay | Admitting: *Deleted

## 2021-11-02 ENCOUNTER — Emergency Department
Admission: EM | Admit: 2021-11-02 | Discharge: 2021-11-02 | Disposition: A | Discharge status: Home / Self Care | Payer: Commercial Managed Care - PPO | Attending: Emergency Medicine | Admitting: Emergency Medicine

## 2021-11-02 ENCOUNTER — Other Ambulatory Visit: Payer: Self-pay

## 2021-11-02 DIAGNOSIS — Z23 Encounter for immunization: Secondary | ICD-10-CM | POA: Insufficient documentation

## 2021-11-02 DIAGNOSIS — Z20822 Contact with and (suspected) exposure to covid-19: Secondary | ICD-10-CM | POA: Diagnosis not present

## 2021-11-02 DIAGNOSIS — R630 Anorexia: Secondary | ICD-10-CM | POA: Insufficient documentation

## 2021-11-02 DIAGNOSIS — M791 Myalgia, unspecified site: Secondary | ICD-10-CM | POA: Insufficient documentation

## 2021-11-02 DIAGNOSIS — R0600 Dyspnea, unspecified: Secondary | ICD-10-CM | POA: Diagnosis not present

## 2021-11-02 DIAGNOSIS — R0789 Other chest pain: Secondary | ICD-10-CM | POA: Diagnosis not present

## 2021-11-02 DIAGNOSIS — D72819 Decreased white blood cell count, unspecified: Secondary | ICD-10-CM | POA: Diagnosis not present

## 2021-11-02 DIAGNOSIS — R110 Nausea: Secondary | ICD-10-CM | POA: Insufficient documentation

## 2021-11-02 DIAGNOSIS — M2550 Pain in unspecified joint: Secondary | ICD-10-CM | POA: Insufficient documentation

## 2021-11-02 DIAGNOSIS — R531 Weakness: Secondary | ICD-10-CM | POA: Diagnosis not present

## 2021-11-02 DIAGNOSIS — R519 Headache, unspecified: Secondary | ICD-10-CM | POA: Insufficient documentation

## 2021-11-02 LAB — BASIC METABOLIC PANEL
Anion gap: 3 — ABNORMAL LOW (ref 5–15)
BUN: 12 mg/dL (ref 6–20)
CO2: 26 mmol/L (ref 22–32)
Calcium: 9.2 mg/dL (ref 8.9–10.3)
Chloride: 110 mmol/L (ref 98–111)
Creatinine, Ser: 0.8 mg/dL (ref 0.61–1.24)
GFR, Estimated: >60 mL/min (ref >60)
Glucose, Bld: 122 mg/dL — ABNORMAL HIGH (ref 70–99)
Potassium: 3.6 mmol/L (ref 3.5–5.1)
Sodium: 139 mmol/L (ref 135–145)

## 2021-11-02 LAB — CBC
HCT: 43.5 % (ref 39.0–52.0)
Hemoglobin: 14.3 g/dL (ref 13.0–17.0)
MCH: 27.8 pg (ref 26.0–34.0)
MCHC: 32.9 g/dL (ref 30.0–36.0)
MCV: 84.6 fL (ref 80.0–100.0)
Platelets: 244 10*3/uL (ref 150–400)
RBC: 5.14 MIL/uL (ref 4.22–5.81)
RDW: 13.4 % (ref 11.5–15.5)
WBC: 3.7 10*3/uL — ABNORMAL LOW (ref 4.0–10.5)
nRBC: 0 % (ref 0.0–0.2)

## 2021-11-02 LAB — RESP PANEL BY RT-PCR (FLU A&B, COVID) ARPGX2
Influenza A by PCR: NEGATIVE
Influenza B by PCR: NEGATIVE
SARS Coronavirus 2 by RT PCR: NEGATIVE

## 2021-11-02 MED ORDER — ACETAMINOPHEN 500 MG PO TABS
1000.0000 mg | ORAL_TABLET | Freq: Once | ORAL | Status: AC
  Administered 2021-11-02: 1000 mg via ORAL
  Filled 2021-11-02: qty 2

## 2021-11-02 MED ORDER — IBUPROFEN 400 MG PO TABS
400.0000 mg | ORAL_TABLET | Freq: Once | ORAL | Status: AC
  Administered 2021-11-02: 400 mg via ORAL
  Filled 2021-11-02: qty 1

## 2021-11-02 MED ORDER — TETANUS-DIPHTH-ACELL PERTUSSIS 5-2.5-18.5 LF-MCG/0.5 IM SUSY
0.5000 mL | PREFILLED_SYRINGE | Freq: Once | INTRAMUSCULAR | Status: AC
  Administered 2021-11-02: 0.5 mL via INTRAMUSCULAR

## 2021-11-02 NOTE — ED Triage Notes (Addendum)
Pt steeped on a nail last week and pt thinks he has sx of tetanus.  Pt reports sob, stiffness and weakness.  No acute distress noted.  Sx began today.  Pt alert  speech clear.   ?

## 2021-11-02 NOTE — Discharge Instructions (Signed)
Your chest x-ray and blood work was all reassuring.  I suspect that you have a viral illness which is causing your body aches.  You can take Tylenol and Motrin and rest make sure you are staying hydrated.  And your symptoms of worsening you are unable to eat or drink please return to the emergency department. ?

## 2021-11-02 NOTE — ED Provider Notes (Signed)
? ?[facility] ?Provider Note ? ? ? Event Date/Time  ? First MD Initiated Contact with Patient 11/02/21 2026   ?  (approximate) ? ? ?History  ? ?Weakness ? ? ?HPI ? ?Dean Simmons is a 26 y.o. male with past medical history of myocarditis who presents with body aches headache joint pain.  Symptoms started today around noon.  Patient cut his foot cut his foot on a dirty nail several days ago and is concerned that he could have tetanus.  He endorses diffuse body aches as well as stiffness in his shoulders elbows and knees.  Has not had fevers or chills.  Has had somewhat decreased appetite and nausea but no vomiting denies diarrhea or abdominal pain.  He is also had some intermittent chest tightness and dyspnea.  Pain is not exertional not pleuritic.  Denies cough runny nose congestion.  Has had 2 episodes of myocarditis in the past which she said was due to a viral infection. ?  ? ?Past Medical History:  ?Diagnosis Date  ? Myocarditis (HCC) 2018  ? ? ?Patient Active Problem List  ? Diagnosis Date Noted  ? Chest pain 05/27/2018  ? Myocarditis (HCC) 05/24/2017  ? ? ? ?Physical Exam  ?Triage Vital Signs: ?ED Triage Vitals [11/02/21 1852]  ?Enc Vitals Group  ?   BP 125/78  ?   Pulse Rate 100  ?   Resp 18  ?   Temp 98.4 ?F (36.9 ?C)  ?   Temp Source Oral  ?   SpO2 96 %  ?   Weight 210 lb (95.3 kg)  ?   Height 6' (1.829 m)  ?   Head Circumference   ?   Peak Flow   ?   Pain Score 5  ?   Pain Loc   ?   Pain Edu?   ?   Excl. in GC?   ? ? ?Most recent vital signs: ?Vitals:  ? 11/02/21 1852  ?BP: 125/78  ?Pulse: 100  ?Resp: 18  ?Temp: 98.4 ?F (36.9 ?C)  ?SpO2: 96%  ? ? ? ?General: Awake, no distress. Anxious appearing ?CV:  Good peripheral perfusion.  ?Resp:  Normal effort. ;lungs are clear ?Abd:  No distention. Soft nontender ?Neuro:             Awake, Alert, Oriented x 3  ?Other:  Clean appearing wound on the right great toe, no evidence of surrounding cellulitis no drainage ? ?To range shoulders and  elbows without difficulty ?No meningismus ? ?Aox3, nml speech  ?PERRL, EOMI, face symmetric, nml tongue movement  ?5/5 strength in the BL upper and lower extremities  ?Sensation grossly intact in the BL upper and lower extremities  ?Finger-nose-finger intact BL ? ? ? ?ED Results / Procedures / Treatments  ?Labs ?(all labs ordered are listed, but only abnormal results are displayed) ?Labs Reviewed  ?BASIC METABOLIC PANEL - Abnormal; Notable for the following components:  ?    Result Value  ? Glucose, Bld 122 (*)   ? Anion gap 3 (*)   ? All other components within normal limits  ?CBC - Abnormal; Notable for the following components:  ? WBC 3.7 (*)   ? All other components within normal limits  ?RESP PANEL BY RT-PCR (FLU A&B, COVID) ARPGX2  ? ? ? ?EKG ? ?EKG interpretation performed by myself: NSR, nml axis, nml intervals, no acute ischemic changes ? ? ? ?RADIOLOGY ?I reviewed the CXR which does not show any acute cardiopulmonary  process; agree with radiology report  ? ? ? ?PROCEDURES: ? ?Critical Care performed: No ? ?Procedures ? ? ? ?MEDICATIONS ORDERED IN ED: ?Medications  ?Tdap (BOOSTRIX) injection 0.5 mL (has no administration in time range)  ?acetaminophen (TYLENOL) tablet 1,000 mg (1,000 mg Oral Given 11/02/21 2117)  ?ibuprofen (ADVIL) tablet 400 mg (400 mg Oral Given 11/02/21 2117)  ? ? ? ?IMPRESSION / MDM / ASSESSMENT AND PLAN / ED COURSE  ?I reviewed the triage vital signs and the nursing notes. ?             ?               ? ?Differential diagnosis includes, but is not limited to, viral infection, pneumonia, myocarditis, less likely ACS, pulmonary embolism ? ? ?Patient is a 26 year old male with prior history of myocarditis who presents with body aches myalgias joint pain headache chest pain and dyspnea.  Symptoms started today around noon.  His chest pain is intermittent nonexertional nonpleuritic.  His vital signs are within normal limits he is not tachycardic saturating well on room air.  Appears well  nontoxic has no meningismus with a nonfocal neurologic exam abdomen is benign lungs are clear.  Given his chest pain and shortness of breath I did obtain an EKG and chest x-ray.  EKG does not show any ischemic changes and chest x-ray has no signs of pulmonary edema or cardiomegaly to suggest myocarditis.  Additionally he is not tachycardic not hypoxic.  He is PERC negative.  Overall his labs are notable for mild leukopenia with a white count of 3.7, BMP otherwise within normal limits.  I suspect viral illness given his constellation of symptoms.  Will send flu and COVID testing.  He was given Tylenol and Motrin for supportive care.  Given patient's concern for tetanus with his recent injury we will give tetanus booster however he is not showing signs of tetanus and there is no signs of wound infection. ?  ? ? ?FINAL CLINICAL IMPRESSION(S) / ED DIAGNOSES  ? ?Final diagnoses:  ?Myalgia  ? ? ? ?Rx / DC Orders  ? ?ED Discharge Orders   ? ? None  ? ?  ? ? ? ?Note:  This document was prepared using Dragon voice recognition software and may include unintentional dictation errors. ?  ?Georga Hacking, MD ?11/02/21 2204 ? ?

## 2022-10-30 IMAGING — CR DG CHEST 2V
2 series · 2 of 2 positions shown · non-contrast
Comparison: 05/27/2018

CLINICAL DATA: Right-sided chest pain

EXAM:
CHEST - 2 VIEW

[chest pa]
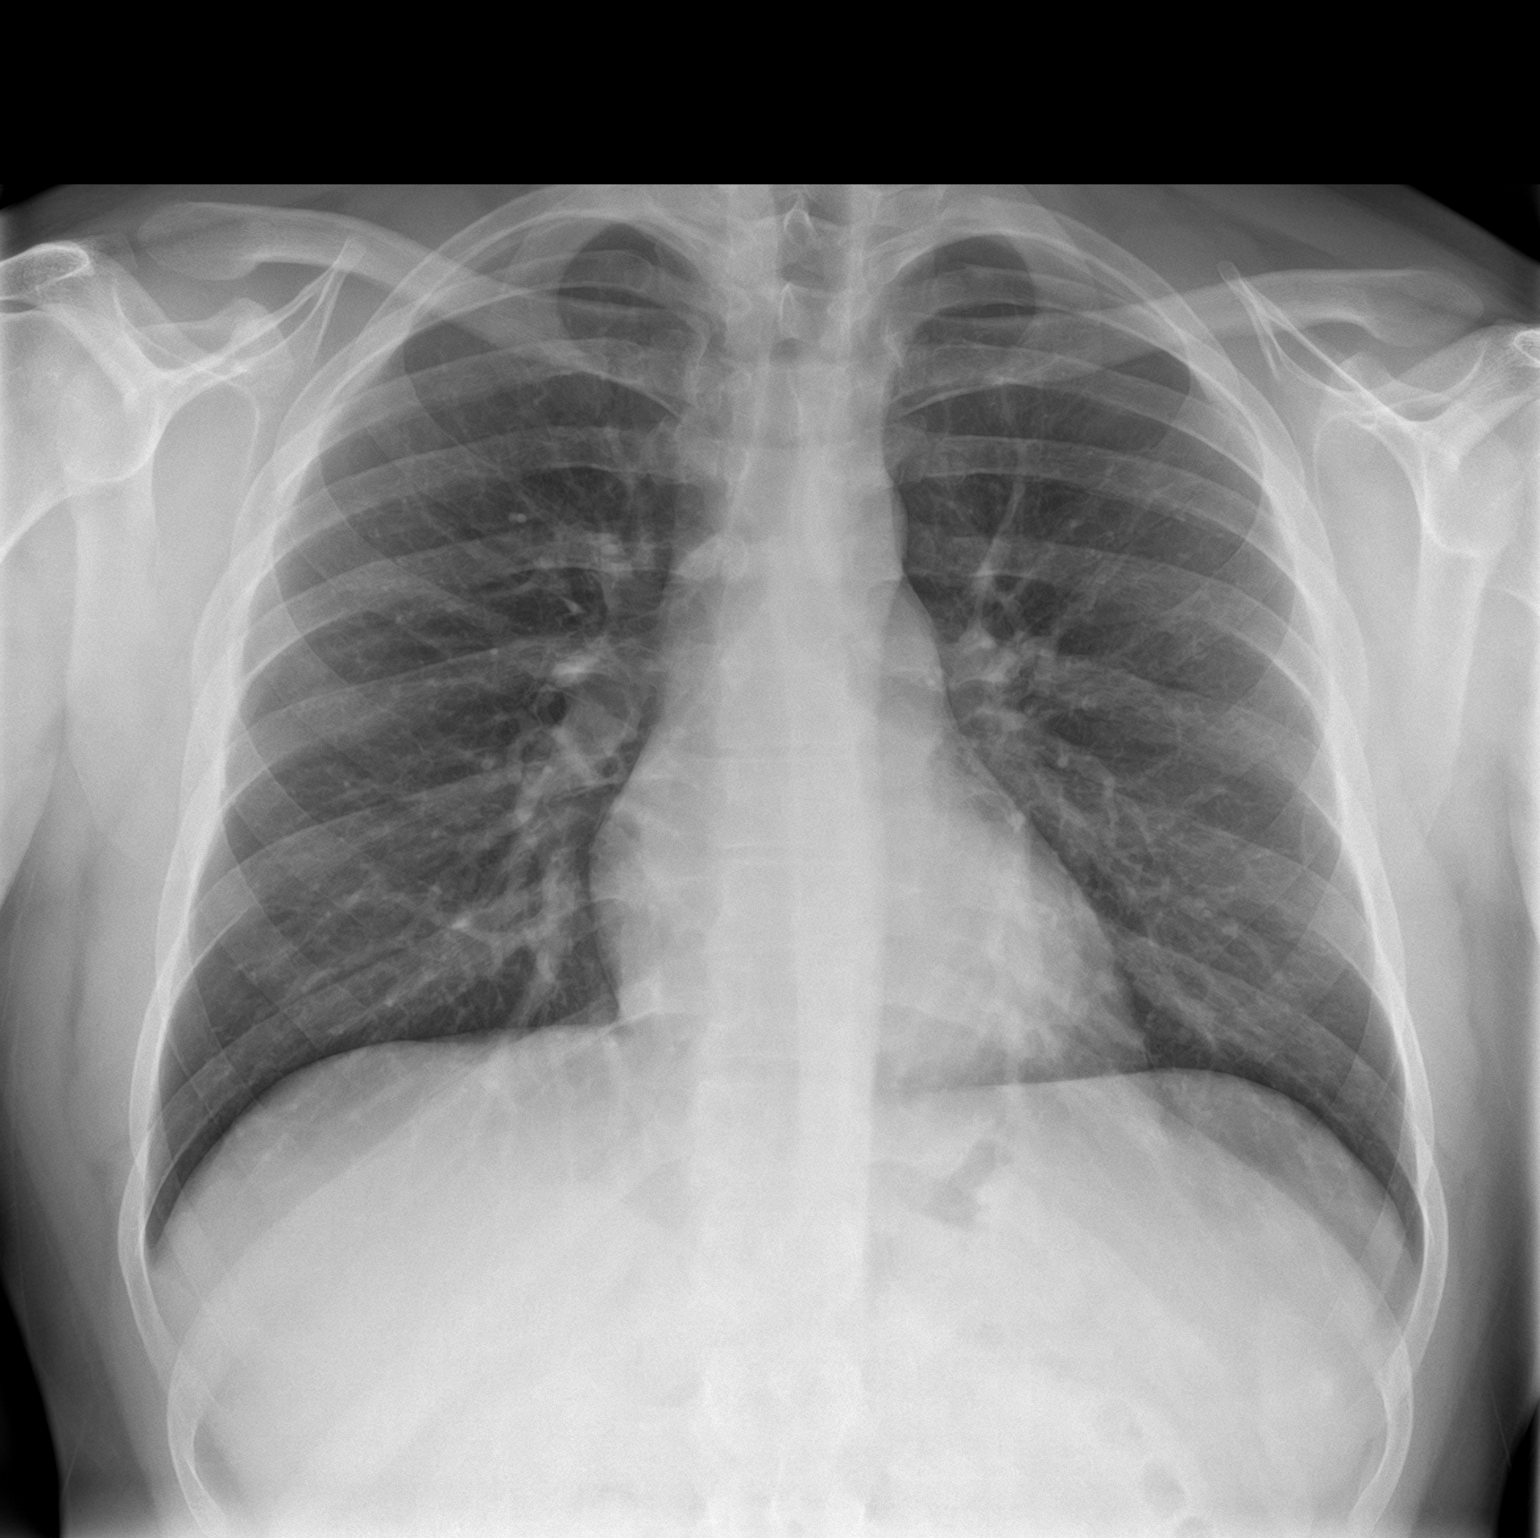

[chest lat]
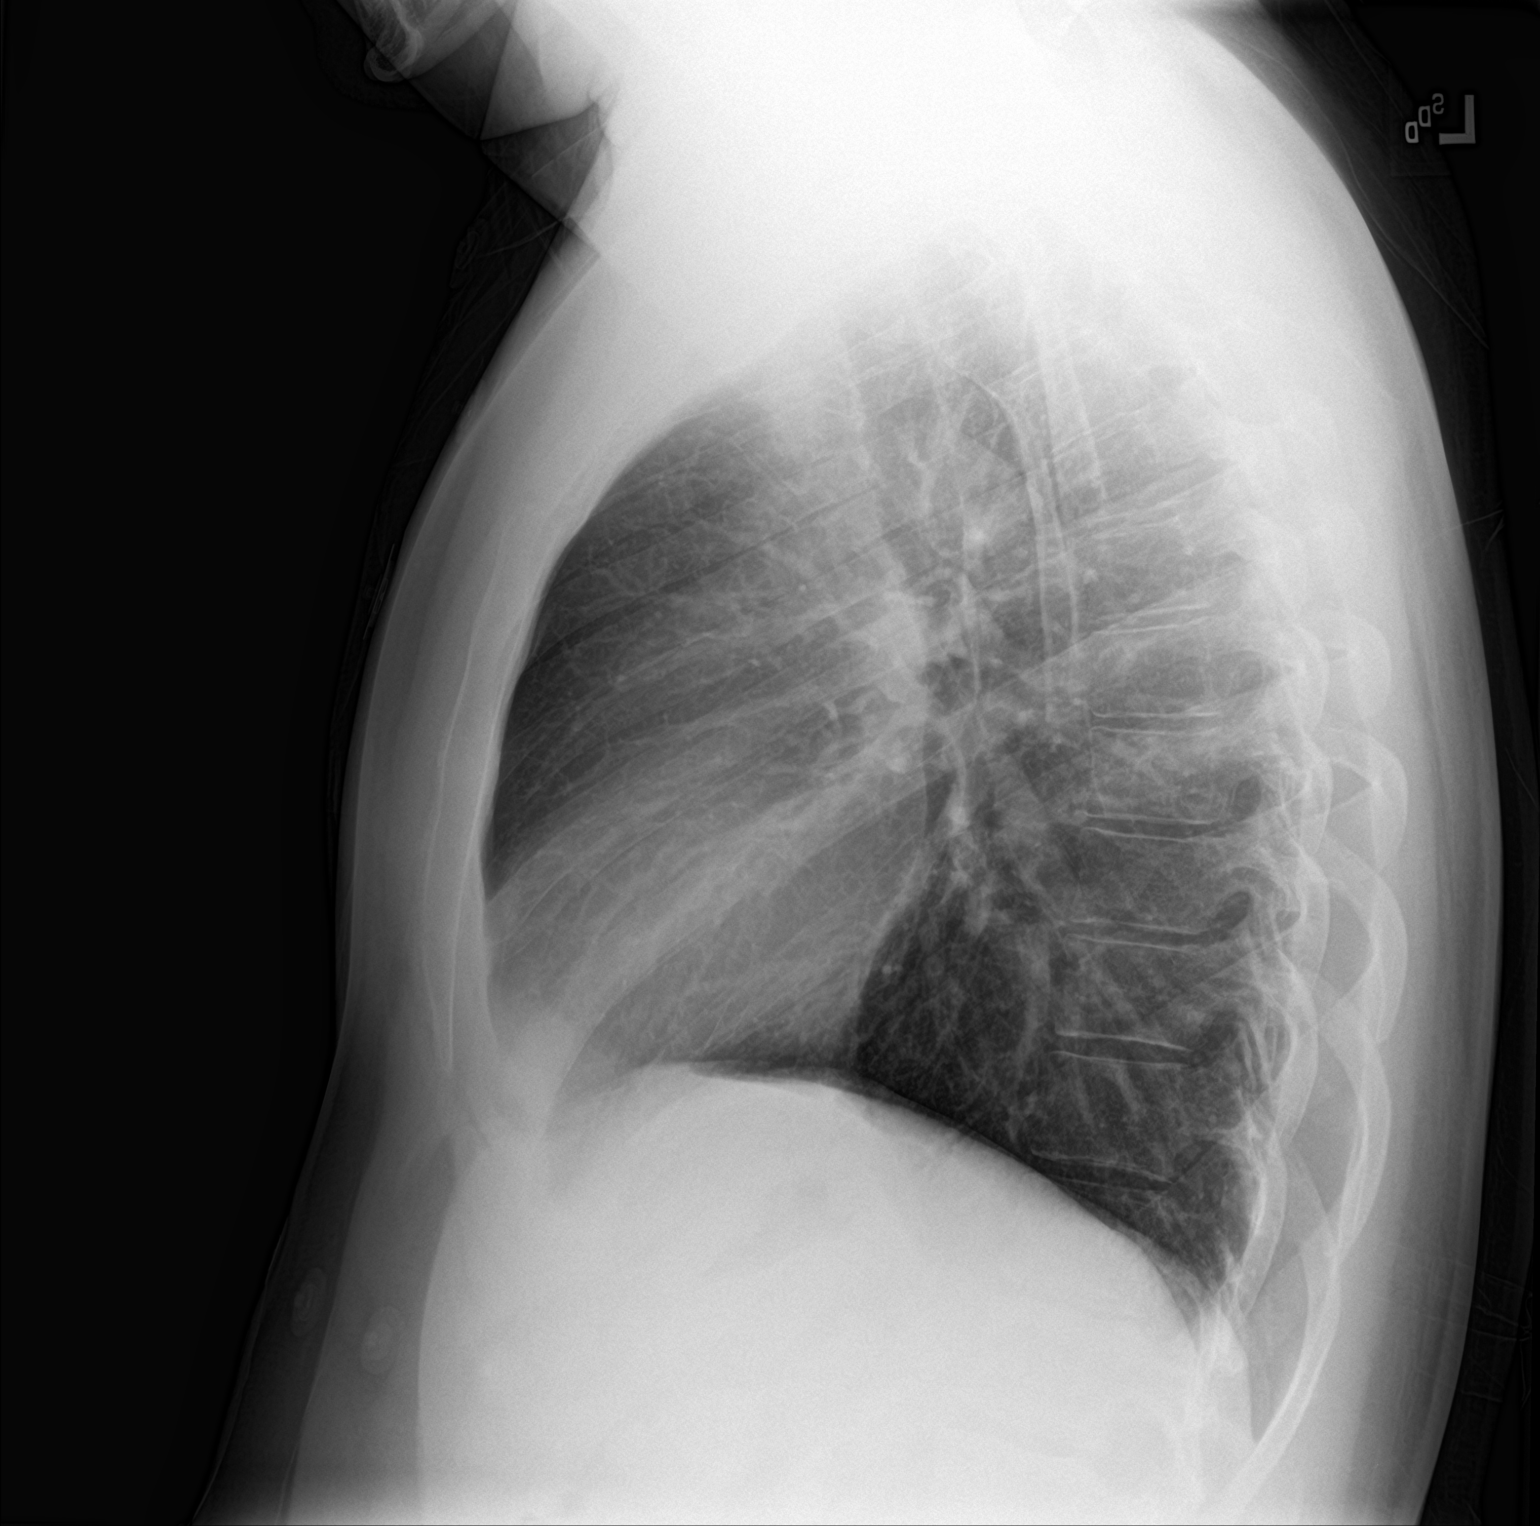

[2 of 2 positions shown; findings below may reference images not displayed]

FINDINGS: The heart size and mediastinal contours are within normal limits.
Both lungs are clear. The visualized skeletal structures are
unremarkable.
IMPRESSION: No acute abnormality of the lungs.

## 2023-10-25 ENCOUNTER — Emergency Department: Payer: Self-pay

## 2023-10-25 ENCOUNTER — Other Ambulatory Visit: Payer: Self-pay

## 2023-10-25 ENCOUNTER — Encounter: Payer: Self-pay | Admitting: Emergency Medicine

## 2023-10-25 DIAGNOSIS — R072 Precordial pain: Secondary | ICD-10-CM | POA: Insufficient documentation

## 2023-10-25 DIAGNOSIS — F419 Anxiety disorder, unspecified: Secondary | ICD-10-CM | POA: Insufficient documentation

## 2023-10-25 DIAGNOSIS — R0602 Shortness of breath: Secondary | ICD-10-CM | POA: Insufficient documentation

## 2023-10-25 LAB — CBC
HCT: 47 % (ref 39.0–52.0)
Hemoglobin: 16 g/dL (ref 13.0–17.0)
MCH: 29.1 pg (ref 26.0–34.0)
MCHC: 34 g/dL (ref 30.0–36.0)
MCV: 85.6 fL (ref 80.0–100.0)
Platelets: 357 10*3/uL (ref 150–400)
RBC: 5.49 MIL/uL (ref 4.22–5.81)
RDW: 12.8 % (ref 11.5–15.5)
WBC: 6.1 10*3/uL (ref 4.0–10.5)
nRBC: 0 % (ref 0.0–0.2)

## 2023-10-25 NOTE — ED Triage Notes (Signed)
 Patient ambulatory to triage with steady gait, without difficulty, appears anxious; reports rt sided CP radiating across upper chest x ; st hx myocarditis

## 2023-10-26 ENCOUNTER — Emergency Department
Admission: EM | Admit: 2023-10-26 | Discharge: 2023-10-26 | Disposition: A | Discharge status: Home / Self Care | Payer: Self-pay | Attending: Emergency Medicine | Admitting: Emergency Medicine

## 2023-10-26 DIAGNOSIS — R0789 Other chest pain: Secondary | ICD-10-CM

## 2023-10-26 LAB — TROPONIN I (HIGH SENSITIVITY)
Troponin I (High Sensitivity): 2 ng/L (ref <18)
Troponin I (High Sensitivity): <2 ng/L (ref <18)

## 2023-10-26 LAB — BASIC METABOLIC PANEL WITH GFR
Anion gap: 10 (ref 5–15)
BUN: 11 mg/dL (ref 6–20)
CO2: 22 mmol/L (ref 22–32)
Calcium: 9.5 mg/dL (ref 8.9–10.3)
Chloride: 109 mmol/L (ref 98–111)
Creatinine, Ser: 1.02 mg/dL (ref 0.61–1.24)
GFR, Estimated: >60 mL/min (ref >60)
Glucose, Bld: 133 mg/dL — ABNORMAL HIGH (ref 70–99)
Potassium: 3.3 mmol/L — ABNORMAL LOW (ref 3.5–5.1)
Sodium: 141 mmol/L (ref 135–145)

## 2023-10-26 NOTE — ED Provider Notes (Signed)
 [facility] Provider Note    Event Date/Time   First MD Initiated Contact with Patient 10/26/23 0126     (approximate)   History   Chest Pain   HPI  Dean Simmons is a 28 y.o. male with a remote history of myocarditis/pericarditis in 2019 who presents with chest pain, acute onset this evening while at work, somewhat pressure-like, substernal, nonradiating, and associated with some shortness of breath.  The patient states that subsequently while in the car he started to feel tingling and paresthesias in his hands and feet bilaterally.  He denies nausea or vomiting.  Denies feeling dizzy or lightheaded.  He has no leg swelling or pain.  He states his symptoms feel different than when he had the myocarditis.  I reviewed the past medical records including the hospitalist discharge summary from 2019 and confirmed the above history.   Physical Exam   Triage Vital Signs: ED Triage Vitals  Encounter Vitals Group     BP 10/25/23 2311 (!) 144/98     Systolic BP Percentile --      Diastolic BP Percentile --      Pulse Rate 10/25/23 2311 (!) 103     Resp 10/25/23 2311 (!) 22     Temp 10/25/23 2311 97.6 F (36.4 C)     Temp Source 10/25/23 2311 Oral     SpO2 10/25/23 2311 97 %     Weight 10/25/23 2309 210 lb (95.3 kg)     Height 10/25/23 2309 6' (1.829 m)     Head Circumference --      Peak Flow --      Pain Score 10/25/23 2309 5     Pain Loc --      Pain Education --      Exclude from Growth Chart --     Most recent vital signs: Vitals:   10/25/23 2311 10/26/23 0130  BP: (!) 144/98 129/89  Pulse: (!) 103 68  Resp: (!) 22 16  Temp: 97.6 F (36.4 C)   SpO2: 97% 99%     General: Alert, slightly anxious appearing, no distress.  CV:  Good peripheral perfusion.  Normal heart sounds. Resp:  Normal effort. Lungs CTAB. Abd:  No distention.  Other:  No calf or popliteal swelling or tenderness.  No peripheral edema.   ED Results / Procedures /  Treatments   Labs (all labs ordered are listed, but only abnormal results are displayed) Labs Reviewed  BASIC METABOLIC PANEL WITH GFR - Abnormal; Notable for the following components:      Result Value   Potassium 3.3 (*)    Glucose, Bld 133 (*)    All other components within normal limits  CBC  TROPONIN I (HIGH SENSITIVITY)  TROPONIN I (HIGH SENSITIVITY)     EKG  ED ECG REPORT I, Lind Repine, the attending physician, personally viewed and interpreted this ECG.  Date: 10/25/2023 EKG Time: 2316 Rate: 104 Rhythm: Sinus tachycardia QRS Axis: normal Intervals: normal ST/T Wave abnormalities: Nonspecific T wave abnormalities Narrative Interpretation: no evidence of acute ischemia    RADIOLOGY  Chest x-ray: I independently viewed and interpreted the images; there is no focal consolidation or edema  PROCEDURES:  Critical Care performed: No  Procedures   MEDICATIONS ORDERED IN ED: Medications - No data to display   IMPRESSION / MDM / ASSESSMENT AND PLAN / ED COURSE  I reviewed the triage vital signs and the nursing notes.  28 year old male with PMH as noted  above presents with atypical, nonexertional chest pain, now resolving.  On exam the patient is well-appearing.  His vital signs are normal.  Physical exam is unremarkable for acute findings.  EKG is nonischemic.  Chest x-ray shows no acute abnormality.  BMP and CBC are normal.  Initial troponin is negative.  Differential diagnosis includes, but is not limited to, musculoskeletal pain, GERD, anxiety, other benign etiology.  I have a low suspicion for ACS.  There is no clinical evidence for PE.  The patient has no sustained tachycardia or hypoxia.  He is PERC negative.  There is no clinical evidence for aortic dissection or other vascular cause.  Will obtain a repeat troponin and reassess.  Patient's presentation is most consistent with acute complicated illness / injury requiring diagnostic workup.  The  patient is on the cardiac monitor to evaluate for evidence of arrhythmia and/or significant heart rate changes.  ----------------------------------------- 2:41 AM on 10/26/2023 -----------------------------------------  Repeat troponin is negative.  The patient is stable for discharge at this time.  I counseled him on the results of the workup and plan of care.  I have given a cardiology referral.  I gave strict return precautions and he expressed understanding.   FINAL CLINICAL IMPRESSION(S) / ED DIAGNOSES   Final diagnoses:  Atypical chest pain     Rx / DC Orders   ED Discharge Orders          Ordered    Ambulatory referral to Cardiology       Comments: If you have not heard from the Cardiology office within the next 72 hours please call [phone].   10/26/23 0240             Note:  This document was prepared using Dragon voice recognition software and may include unintentional dictation errors.    Lind Repine, MD 10/26/23 [phone]

## 2023-10-26 NOTE — Discharge Instructions (Signed)
 Your workup in the ER today is reassuring.  Return to the ER for new, worsening, or persistent severe pain, difficulty breathing, or any other new or worsening symptoms that concern you.  We have provided an outpatient referral to cardiology.
